# Patient Record
Sex: Female | Born: 1960
Health system: Southern US, Community
[De-identification: ages and names within clinical notes are randomized; demographics above are authoritative.]

## PROBLEM LIST (undated history)

## (undated) DIAGNOSIS — N289 Disorder of kidney and ureter, unspecified: Secondary | ICD-10-CM

## (undated) DIAGNOSIS — I10 Essential (primary) hypertension: Secondary | ICD-10-CM

---

## 2005-02-22 ENCOUNTER — Other Ambulatory Visit: Admission: RE | Admit: 2005-02-22 | Discharge: 2005-02-22 | Payer: Self-pay | Admitting: Family Medicine

## 2006-05-30 ENCOUNTER — Other Ambulatory Visit: Admission: RE | Admit: 2006-05-30 | Discharge: 2006-05-30 | Payer: Self-pay | Admitting: Family Medicine

## 2009-10-23 ENCOUNTER — Emergency Department (HOSPITAL_COMMUNITY): Admission: EM | Admit: 2009-10-23 | Discharge: 2009-10-23 | Payer: Self-pay | Admitting: Emergency Medicine

## 2009-10-25 ENCOUNTER — Emergency Department (HOSPITAL_COMMUNITY): Admission: EM | Admit: 2009-10-25 | Discharge: 2009-10-25 | Payer: Self-pay | Admitting: Emergency Medicine

## 2009-10-26 ENCOUNTER — Ambulatory Visit (HOSPITAL_COMMUNITY): Admission: RE | Admit: 2009-10-26 | Discharge: 2009-10-26 | Payer: Self-pay | Admitting: Emergency Medicine

## 2015-09-25 ENCOUNTER — Emergency Department (HOSPITAL_COMMUNITY): Payer: BLUE CROSS/BLUE SHIELD

## 2015-09-25 ENCOUNTER — Inpatient Hospital Stay (HOSPITAL_COMMUNITY)
Admission: EM | Admit: 2015-09-25 | Discharge: 2015-09-27 | DRG: 158 | Disposition: A | Discharge status: Home / Self Care | Payer: Self-pay | Attending: Internal Medicine | Admitting: Internal Medicine

## 2015-09-25 ENCOUNTER — Encounter (HOSPITAL_COMMUNITY): Payer: Self-pay

## 2015-09-25 DIAGNOSIS — K047 Periapical abscess without sinus: Principal | ICD-10-CM | POA: Diagnosis present

## 2015-09-25 DIAGNOSIS — Z6837 Body mass index (BMI) 37.0-37.9, adult: Secondary | ICD-10-CM

## 2015-09-25 DIAGNOSIS — N179 Acute kidney failure, unspecified: Secondary | ICD-10-CM | POA: Diagnosis present

## 2015-09-25 DIAGNOSIS — I10 Essential (primary) hypertension: Secondary | ICD-10-CM | POA: Diagnosis present

## 2015-09-25 DIAGNOSIS — N289 Disorder of kidney and ureter, unspecified: Secondary | ICD-10-CM | POA: Insufficient documentation

## 2015-09-25 DIAGNOSIS — I959 Hypotension, unspecified: Secondary | ICD-10-CM | POA: Diagnosis present

## 2015-09-25 DIAGNOSIS — E860 Dehydration: Secondary | ICD-10-CM | POA: Diagnosis present

## 2015-09-25 DIAGNOSIS — R55 Syncope and collapse: Secondary | ICD-10-CM | POA: Diagnosis present

## 2015-09-25 DIAGNOSIS — E876 Hypokalemia: Secondary | ICD-10-CM | POA: Diagnosis not present

## 2015-09-25 HISTORY — DX: Disorder of kidney and ureter, unspecified: N28.9

## 2015-09-25 HISTORY — DX: Essential (primary) hypertension: I10

## 2015-09-25 LAB — CBC WITH DIFFERENTIAL/PLATELET
Basophils Absolute: 0 10*3/uL (ref 0.0–0.1)
Basophils Relative: 0 %
EOS ABS: 0.1 10*3/uL (ref 0.0–0.7)
Eosinophils Relative: 1 %
HEMATOCRIT: 41.2 % (ref 36.0–46.0)
HEMOGLOBIN: 13.6 g/dL (ref 12.0–15.0)
LYMPHS ABS: 1.7 10*3/uL (ref 0.7–4.0)
LYMPHS PCT: 15 %
MCH: 27.1 pg (ref 26.0–34.0)
MCHC: 33 g/dL (ref 30.0–36.0)
MCV: 82.2 fL (ref 78.0–100.0)
Monocytes Absolute: 1.1 10*3/uL — ABNORMAL HIGH (ref 0.1–1.0)
Monocytes Relative: 9 %
NEUTROS ABS: 8.5 10*3/uL — AB (ref 1.7–7.7)
NEUTROS PCT: 75 %
Platelets: 345 10*3/uL (ref 150–400)
RBC: 5.01 MIL/uL (ref 3.87–5.11)
RDW: 13.4 % (ref 11.5–15.5)
WBC: 11.4 10*3/uL — AB (ref 4.0–10.5)

## 2015-09-25 LAB — URINALYSIS, ROUTINE W REFLEX MICROSCOPIC
Bilirubin Urine: NEGATIVE
Glucose, UA: NEGATIVE mg/dL
Ketones, ur: NEGATIVE mg/dL
NITRITE: NEGATIVE
PH: 5 (ref 5.0–8.0)
Protein, ur: NEGATIVE mg/dL
SPECIFIC GRAVITY, URINE: 1.012 (ref 1.005–1.030)

## 2015-09-25 LAB — COMPREHENSIVE METABOLIC PANEL
ALT: 17 U/L (ref 14–54)
AST: 16 U/L (ref 15–41)
Albumin: 3.3 g/dL — ABNORMAL LOW (ref 3.5–5.0)
Alkaline Phosphatase: 92 U/L (ref 38–126)
Anion gap: 13 (ref 5–15)
BILIRUBIN TOTAL: 0.4 mg/dL (ref 0.3–1.2)
BUN: 29 mg/dL — ABNORMAL HIGH (ref 6–20)
CALCIUM: 10 mg/dL (ref 8.9–10.3)
CHLORIDE: 100 mmol/L — AB (ref 101–111)
CO2: 23 mmol/L (ref 22–32)
Creatinine, Ser: 1.76 mg/dL — ABNORMAL HIGH (ref 0.44–1.00)
GFR, EST AFRICAN AMERICAN: 37 mL/min — AB (ref >60)
GFR, EST NON AFRICAN AMERICAN: 32 mL/min — AB (ref >60)
Glucose, Bld: 158 mg/dL — ABNORMAL HIGH (ref 65–99)
POTASSIUM: 3.7 mmol/L (ref 3.5–5.1)
Sodium: 136 mmol/L (ref 135–145)
Total Protein: 7.8 g/dL (ref 6.5–8.1)

## 2015-09-25 LAB — URINE MICROSCOPIC-ADD ON
BACTERIA UA: NONE SEEN
RBC / HPF: NONE SEEN RBC/hpf (ref 0–5)

## 2015-09-25 LAB — I-STAT CG4 LACTIC ACID, ED
LACTIC ACID, VENOUS: 1.56 mmol/L (ref 0.5–2.0)
Lactic Acid, Venous: 1.9 mmol/L (ref 0.5–2.0)

## 2015-09-25 MED ORDER — ACETAMINOPHEN 650 MG RE SUPP
650.0000 mg | Freq: Four times a day (QID) | RECTAL | Status: DC | PRN
Start: 2015-09-25 — End: 2015-09-27

## 2015-09-25 MED ORDER — IOHEXOL 300 MG/ML  SOLN
50.0000 mL | Freq: Once | INTRAMUSCULAR | Status: AC | PRN
  Administered 2015-09-25: 50 mL via INTRAVENOUS

## 2015-09-25 MED ORDER — MORPHINE SULFATE (PF) 4 MG/ML IV SOLN
4.0000 mg | Freq: Once | INTRAVENOUS | Status: AC
  Administered 2015-09-25: 4 mg via INTRAVENOUS
  Filled 2015-09-25: qty 1

## 2015-09-25 MED ORDER — ONDANSETRON HCL 4 MG PO TABS: 4.0000 mg | ORAL_TABLET | Freq: Four times a day (QID) | ORAL | Status: DC | PRN

## 2015-09-25 MED ORDER — ACETAMINOPHEN 325 MG PO TABS
650.0000 mg | ORAL_TABLET | Freq: Four times a day (QID) | ORAL | Status: DC | PRN
Start: 2015-09-25 — End: 2015-09-27
  Administered 2015-09-25 – 2015-09-27 (×6): 650 mg via ORAL
  Filled 2015-09-25 (×6): qty 2

## 2015-09-25 MED ORDER — CYCLOBENZAPRINE HCL 5 MG PO TABS
5.0000 mg | ORAL_TABLET | Freq: Three times a day (TID) | ORAL | Status: DC | PRN
  Administered 2015-09-25 – 2015-09-27 (×5): 5 mg via ORAL
  Filled 2015-09-25 (×5): qty 1

## 2015-09-25 MED ORDER — ONDANSETRON HCL 4 MG/2ML IJ SOLN
4.0000 mg | Freq: Four times a day (QID) | INTRAMUSCULAR | Status: DC | PRN
Start: 2015-09-25 — End: 2015-09-27

## 2015-09-25 MED ORDER — WHITE PETROLATUM GEL
Status: AC
  Administered 2015-09-25: 1
  Filled 2015-09-25: qty 1

## 2015-09-25 MED ORDER — SODIUM CHLORIDE 0.9 % IV BOLUS (SEPSIS)
1000.0000 mL | Freq: Once | INTRAVENOUS | Status: AC
  Administered 2015-09-25: 1000 mL via INTRAVENOUS

## 2015-09-25 MED ORDER — OXYCODONE HCL 5 MG PO TABS
10.0000 mg | ORAL_TABLET | ORAL | Status: DC | PRN
  Administered 2015-09-25 – 2015-09-27 (×10): 10 mg via ORAL
  Filled 2015-09-25 (×10): qty 2

## 2015-09-25 MED ORDER — CLINDAMYCIN PHOSPHATE 600 MG/50ML IV SOLN
600.0000 mg | Freq: Three times a day (TID) | INTRAVENOUS | Status: DC
Start: 2015-09-25 — End: 2015-09-27
  Administered 2015-09-25 – 2015-09-27 (×7): 600 mg via INTRAVENOUS
  Filled 2015-09-25 (×9): qty 50

## 2015-09-25 MED ORDER — SODIUM CHLORIDE 0.9 % IV SOLN
INTRAVENOUS | Status: AC
  Administered 2015-09-25: 19:00:00 via INTRAVENOUS

## 2015-09-25 MED ORDER — ONDANSETRON HCL 4 MG/2ML IJ SOLN
4.0000 mg | Freq: Once | INTRAMUSCULAR | Status: AC
  Administered 2015-09-25: 4 mg via INTRAVENOUS
  Filled 2015-09-25: qty 2

## 2015-09-25 MED ORDER — ALUM & MAG HYDROXIDE-SIMETH 200-200-20 MG/5ML PO SUSP: 30.0000 mL | Freq: Four times a day (QID) | ORAL | Status: DC | PRN

## 2015-09-25 NOTE — ED Provider Notes (Signed)
CSN: [id number]     Arrival date & time 09/25/15  0944 History   First MD Initiated Contact with Patient 09/25/15 1026     Chief Complaint  Patient presents with  . Dental Pain   HPI   [name] is an 55 y.o. female with h/o HTN who originally presented to the ED for dental pain, now being evaluated as possible sepsis patient. Pt was in triage having labs drawn. After labs were drawn she apparently lost consciousness, then vomited had urinary incontinence. She was immediately moved to trauma. In the room now pt is pale, cool, and clammy but alert and oriented. She does not remember what happened. She states she is here for a dental infection. She states she has never had a syncopal episode before. Pt reports she started having upper left dental pain and trismus on 12/25, [facility] and was prescribed PCN and Vicodin. She states neither of the meds have worked.  She states she has been unable to eat since Christmas. Feels very weak and tired. Denies other medical problems.   In the ED she has been hypotensive. She states she took her usual dose of lisinopril this morning but no other medications. She has not taken Vicodin today. She denies chest pain but is coughing and states she cannot breathe well. She does have marked trismus though no dysphagia. She is afebrile and not tachycardic.   History reviewed. No pertinent past medical history. History reviewed. No pertinent past surgical history. No family history on file. Social History  Substance Use Topics  . Smoking status: Never Smoker   . Smokeless tobacco: None  . Alcohol Use: No   OB History    No data available     Review of Systems  All other systems reviewed and are negative.     Allergies  Review of patient's allergies indicates no known allergies.  Home Medications   Prior to Admission medications   Not on File   BP 89/67 mmHg  Pulse 85  Temp(Src) 98.3 F (36.8 C) (Oral)  Resp 22  Ht 5' (1.524 m)  Wt  87.544 kg  BMI 37.69 kg/m2  SpO2 97%  LMP 09/25/2015 Physical Exam  Constitutional: She is oriented to person, place, and time. No distress.  HENT:  Right Ear: External ear normal.  Left Ear: External ear normal.  Nose: Nose normal.  Mouth/Throat: Mucous membranes are pale. There is trismus in the jaw.  Marked limitation to opening of jaw. Pt able to protrude tongue. No visible oral lesions or abscesses. Dentition with some gross caries and gingival inflammation/erythema, though no gross abscess noted. Cannot visualize posterior oropharynx due to trismus. No cervical LAD or masses.   Eyes: Conjunctivae and EOM are normal. Pupils are equal, round, and reactive to light.  Neck: Normal range of motion. Neck supple.  Cardiovascular: Normal rate, regular rhythm, normal heart sounds and intact distal pulses.   Pulmonary/Chest: Effort normal and breath sounds normal. No accessory muscle usage. No respiratory distress. She exhibits no tenderness.  Few bilateral expiratory wheezes that clear with coughing  Abdominal: Soft. Bowel sounds are normal. She exhibits no distension. There is no tenderness. There is no rebound and no guarding.  Musculoskeletal: She exhibits no edema.  Neurological: She is alert and oriented to person, place, and time. No cranial nerve deficit.  Skin: She is diaphoretic. There is pallor.  Psychiatric: She has a normal mood and affect.  Nursing note and vitals reviewed.  ED Course  Procedures (including critical care time) Labs Review Labs Reviewed  COMPREHENSIVE METABOLIC PANEL - Abnormal; Notable for the following:    Chloride 100 (*)    Glucose, Bld 158 (*)    BUN 29 (*)    Creatinine, Ser 1.76 (*)    Albumin 3.3 (*)    GFR calc non Af Amer 32 (*)    GFR calc Af Amer 37 (*)    All other components within normal limits  CBC WITH DIFFERENTIAL/PLATELET - Abnormal; Notable for the following:    WBC 11.4 (*)    Neutro Abs 8.5 (*)    Monocytes Absolute 1.1 (*)     All other components within normal limits  URINALYSIS, ROUTINE W REFLEX MICROSCOPIC (NOT AT [facility]) - Abnormal; Notable for the following:    APPearance CLOUDY (*)    Hgb urine dipstick SMALL (*)    Leukocytes, UA SMALL (*)    All other components within normal limits  URINE MICROSCOPIC-ADD ON - Abnormal; Notable for the following:    Squamous Epithelial / LPF 0-5 (*)    Casts WBC CAST (*)    All other components within normal limits  CULTURE, BLOOD (ROUTINE X 2)  CULTURE, BLOOD (ROUTINE X 2)  URINE CULTURE  I-STAT CG4 LACTIC ACID, ED  I-STAT CG4 LACTIC ACID, ED    Imaging Review Ct Maxillofacial W/cm  09/25/2015  CLINICAL DATA:  Hrs. EXAM: CT MAXILLOFACIAL WITH CONTRAST TECHNIQUE: Multidetector CT imaging of the maxillofacial structures was performed with intravenous contrast. Multiplanar CT image reconstructions were also generated. A small metallic BB was placed on the right temple in order to reliably differentiate right from left. CONTRAST:  50mL OMNIPAQUE IOHEXOL 300 MG/ML  SOLN COMPARISON:  None. FINDINGS: Bone windows show circumferential lucency around the roots of the left upper first molar, consistent with periapical abscess. There is some minimal edema/ inflammation in the soft tissues over the left base, but no underlying focal or drainable abscess. Temporomandibular joints are located. There is some mucosal thickening in the left maxillary sinus adjacent to the periapical abscess, but no air-fluid level. The remaining visualized paranasal sinuses are clear. IMPRESSION: Lucency around the roots of the upper left first molar consistent with periapical abscess. Electronically Signed   By: Kennith Center M.D.   On: 09/25/2015 13:33   Dg Chest Portable 1 View  09/25/2015  CLINICAL DATA:  Per patient she came to the ED because of a tooth infection, patients left jaw is swollen and patient started to get severely SOB today. EXAM: PORTABLE CHEST 1 VIEW COMPARISON:  None. FINDINGS: Numerous  leads and wires project over the chest. Mild right hemidiaphragm elevation. Midline trachea. Normal heart size. No pleural effusion or pneumothorax. Clear lungs. IMPRESSION: No acute cardiopulmonary disease. Electronically Signed   By: Jeronimo Greaves M.D.   On: 09/25/2015 11:39   I have personally reviewed and evaluated these images and lab results as part of my medical decision-making.   EKG Interpretation None      Filed Vitals:   09/25/15 1100 09/25/15 1107 09/25/15 1115 09/25/15 1338  BP: 102/78 106/72 115/78 127/88  Pulse: 73 63 73 73  Temp:      TempSrc:      Resp: 16 18 16 20   Height:      Weight:      SpO2: 94% 97% 96% 97%      MDM   Final diagnoses:  Syncope, unspecified syncope type  Renal insufficiency  Periapical abscess  BP improving with 2L of NS bolus. Slight white count of 11.4. Labs otherwise unrevealing. CXR negative.  Due to trismus and inability to perform good oral exam, CT maxillofacial was ordered to better evaluate extent of dental infection and other possible concomittant processes. CT showd periapical abscess and some mucosal thickening of left maxillary sinus but no underlying focal/drainable abscess above left first molar. Pt's vitals have been steadily improving. She has improvement in pain with pain meds. However, given syncopal episode with hypotension [facility] Syncope rule cannot exclude), failure of outpatient antibiotic therapy, inability new tolerate PO for adequate food intake, and new renal insufficiency, will call hospitalists for admission.   Spoke to Dr. Adrian Blackwater. Will admit to medicine and start on IV clindamycin in the ED.    Carlene Coria, PA-C 09/26/15 1938  Cathren Laine, MD 09/27/15 1010

## 2015-09-25 NOTE — ED Notes (Signed)
Pharmacy Tech at bedside  

## 2015-09-25 NOTE — H&P (Addendum)
History and Physical  Savannah Ray ZOX:[id number] DOB: February 25, 1961 DOA: 09/25/2015  Referring physician: Jolly MangoSerena Sam, PA-C, ED provider PCP: No primary care provider on file.   Chief Complaint: dental pain, syncope  HPI: Savannah Ray is a 55 y.o. female  With a history of hypertension who presents to the hospital for dental infection. The patient was placed on penicillin 5 days ago for a dental abscess that started a week ago. She is out of difficult time opening her mouth has not been eating foods comfortably because of the pain. Vicodin does not help her pain. Chewing worsens her pain. In the triage area, the patient felt lightheaded, dizzy, felt hot and flushed with nausea and had a syncopal episode. She was noted be quite hypotensive. She was fluid resuscitated in the emergency department. CT of her head shows an apical dental abscess in the left maxillary area.   Review of Systems:   Pt complains of Night chills.  Pt denies any fevers, nausea, vomiting, diarrhea, constipation, abdominal pain, shortness of breath, dyspnea on exertion, orthopnea, cough, wheezing, palpitations, headache, vision changes, lightheadedness, dizziness, diarrhea, constipation, melena, rectal bleeding.  Review of systems are otherwise negative  Past Medical History  Diagnosis Date  . Hypertension    History reviewed. No pertinent past surgical history. Social History:  reports that she has never smoked. She does not have any smokeless tobacco history on file. She reports that she does not drink alcohol or use illicit drugs. Patient lives at home & is able to participate in activities of daily living  No Known Allergies  Patient unaware of family history  Prior to Admission medications   Medication Sig Start Date End Date Taking? Authorizing Provider  Biotin w/ Vitamins C & E (HAIR SKIN & NAILS GUMMIES) 1250-7.5-7.5 MCG-MG-UNT CHEW Chew 1 tablet by mouth daily.   Yes Historical Provider, MD    ibuprofen (ADVIL,MOTRIN) 200 MG tablet Take 200 mg by mouth every 6 (six) hours as needed for moderate pain.   Yes Historical Provider, MD  lisinopril-hydrochlorothiazide (PRINZIDE,ZESTORETIC) 20-12.5 MG tablet Take 1 tablet by mouth daily.   Yes Historical Provider, MD  vitamin B-12 (CYANOCOBALAMIN) 100 MCG tablet Take 100 mcg by mouth daily.   Yes Historical Provider, MD    Physical Exam: BP 128/80 mmHg  Pulse 82  Temp(Src) 98.3 F (36.8 C) (Oral)  Resp 21  Ht 5' (1.524 m)  Wt 87.544 kg (193 lb)  BMI 37.69 kg/m2  SpO2 98%  LMP 09/25/2015  General: Middle-aged black female. Awake and alert and oriented x3. No acute cardiopulmonary distress.  Eyes: Pupils equal, round, reactive to light. Extraocular muscles are intact. Sclerae anicteric and noninjected.  ENT:  Dry mucosal membranes. No mucosal lesions. Significant trismus to approximately 3 cm Neck: Neck supple without lymphadenopathy. No carotid bruits. No masses palpated.  Cardiovascular: Regular rate with normal S1-S2 sounds. No murmurs, rubs, gallops auscultated. No JVD.  Respiratory: Good respiratory effort with no wheezes, rales, rhonchi. Lungs clear to auscultation bilaterally.  Abdomen: Soft, nontender, nondistended. Active bowel sounds. No masses or hepatosplenomegaly  Skin: Dry, warm to touch. 2+ dorsalis pedis and radial pulses. Musculoskeletal: No calf or leg pain. All major joints not erythematous nontender.  Psychiatric: Intact judgment and insight.  Neurologic: No focal neurological deficits. Cranial nerves II through XII are grossly intact.           Labs on Admission:  Basic Metabolic Panel:  Recent Labs Lab 09/25/15 1037  NA 136  K 3.7  CL 100*  CO2 23  GLUCOSE 158*  BUN 29*  CREATININE 1.76*  CALCIUM 10.0   Liver Function Tests:  Recent Labs Lab 09/25/15 1037  AST 16  ALT 17  ALKPHOS 92  BILITOT 0.4  PROT 7.8  ALBUMIN 3.3*   No results for input(s): LIPASE, AMYLASE in the last 168  hours. No results for input(s): AMMONIA in the last 168 hours. CBC:  Recent Labs Lab 09/25/15 1037  WBC 11.4*  NEUTROABS 8.5*  HGB 13.6  HCT 41.2  MCV 82.2  PLT 345   Cardiac Enzymes: No results for input(s): CKTOTAL, CKMB, CKMBINDEX, TROPONINI in the last 168 hours.  BNP (last 3 results) No results for input(s): BNP in the last 8760 hours.  ProBNP (last 3 results) No results for input(s): PROBNP in the last 8760 hours.  CBG: No results for input(s): GLUCAP in the last 168 hours.  Radiological Exams on Admission: Ct Maxillofacial W/cm  09/25/2015  CLINICAL DATA:  Hrs. EXAM: CT MAXILLOFACIAL WITH CONTRAST TECHNIQUE: Multidetector CT imaging of the maxillofacial structures was performed with intravenous contrast. Multiplanar CT image reconstructions were also generated. A small metallic BB was placed on the right temple in order to reliably differentiate right from left. CONTRAST:  50mL OMNIPAQUE IOHEXOL 300 MG/ML  SOLN COMPARISON:  None. FINDINGS: Bone windows show circumferential lucency around the roots of the left upper first molar, consistent with periapical abscess. There is some minimal edema/ inflammation in the soft tissues over the left base, but no underlying focal or drainable abscess. Temporomandibular joints are located. There is some mucosal thickening in the left maxillary sinus adjacent to the periapical abscess, but no air-fluid level. The remaining visualized paranasal sinuses are clear. IMPRESSION: Lucency around the roots of the upper left first molar consistent with periapical abscess. Electronically Signed   By: Kennith Center M.D.   On: 09/25/2015 13:33   Dg Chest Portable 1 View  09/25/2015  CLINICAL DATA:  Per patient she came to the ED because of a tooth infection, patients left jaw is swollen and patient started to get severely SOB today. EXAM: PORTABLE CHEST 1 VIEW COMPARISON:  None. FINDINGS: Numerous leads and wires project over the chest. Mild right  hemidiaphragm elevation. Midline trachea. Normal heart size. No pleural effusion or pneumothorax. Clear lungs. IMPRESSION: No acute cardiopulmonary disease. Electronically Signed   By: Jeronimo Greaves M.D.   On: 09/25/2015 11:39    Assessment/Plan Present on Admission:  . Periapical abscess . Vasovagal syncope . Acute renal injury (HCC) . Dehydration  This patient was discussed with the ED physician, including pertinent vitals, physical exam findings, labs, and imaging.  We also discussed care given by the ED provider.  #1 vasovagal syncope  Observation on telemetry  IV fluids overnight #2 periapical abscess - failed outpatient  Clindamycin 600 mg every 8 hours  Recheck CBC in the morning  Flexeril for trismus  Oxycodone for pain #3 acute renal injury  Will fluid resuscitate  Recheck creatinine in the morning #4 dehydration  DVT prophylaxis: SCDs  Consultants: None  Code Status: Full code  Family Communication: Husband in the room   Disposition Plan: Observation with likely discharge tomorrow   Levie Heritage, DO [facility] Pager [phone]

## 2015-09-25 NOTE — ED Notes (Signed)
Pt started feeling

## 2015-09-25 NOTE — ED Notes (Signed)
Pot returns from radiology

## 2015-09-25 NOTE — ED Notes (Signed)
After blood drawn in triage, pt had syncopal episode with vomiting and urinary incontinence. Moved immediately to Trauma A. Care assumed by Trauma staff.

## 2015-09-25 NOTE — ED Notes (Addendum)
Pt. Having lt. Upper tooth pain was seen by a dentist and placed on antibiotics and Vicodin, one week ago but has not had any relief.  Pt. Denies any an/v/d  . Pt. Is very weak and has not ate any foods since Christmas. She also stopped taking her medication due to no relief. Pt. Is very lethargic in triage , she will not stay in the seat,.  She wants to lay on the floor.  Assistance needed to help her stand and move.  No swelling noted to her face

## 2015-09-25 NOTE — ED Notes (Signed)
Pt assisted to BR via w/c without any dizziness/weakness. Report given to [facility], Charity fundraiserN.

## 2015-09-26 DIAGNOSIS — E860 Dehydration: Secondary | ICD-10-CM

## 2015-09-26 DIAGNOSIS — K047 Periapical abscess without sinus: Principal | ICD-10-CM

## 2015-09-26 DIAGNOSIS — R55 Syncope and collapse: Secondary | ICD-10-CM

## 2015-09-26 DIAGNOSIS — N179 Acute kidney failure, unspecified: Secondary | ICD-10-CM

## 2015-09-26 LAB — CBC
HEMATOCRIT: 34.2 % — AB (ref 36.0–46.0)
Hemoglobin: 11.2 g/dL — ABNORMAL LOW (ref 12.0–15.0)
MCH: 26.8 pg (ref 26.0–34.0)
MCHC: 32.7 g/dL (ref 30.0–36.0)
MCV: 81.8 fL (ref 78.0–100.0)
Platelets: 316 10*3/uL (ref 150–400)
RBC: 4.18 MIL/uL (ref 3.87–5.11)
RDW: 13.5 % (ref 11.5–15.5)
WBC: 12.4 10*3/uL — AB (ref 4.0–10.5)

## 2015-09-26 LAB — URINE CULTURE

## 2015-09-26 LAB — BASIC METABOLIC PANEL
ANION GAP: 9 (ref 5–15)
BUN: 21 mg/dL — AB (ref 6–20)
CALCIUM: 8.8 mg/dL — AB (ref 8.9–10.3)
CO2: 24 mmol/L (ref 22–32)
Chloride: 105 mmol/L (ref 101–111)
Creatinine, Ser: 1.27 mg/dL — ABNORMAL HIGH (ref 0.44–1.00)
GFR calc Af Amer: 55 mL/min — ABNORMAL LOW (ref >60)
GFR, EST NON AFRICAN AMERICAN: 48 mL/min — AB (ref >60)
GLUCOSE: 108 mg/dL — AB (ref 65–99)
POTASSIUM: 3.7 mmol/L (ref 3.5–5.1)
SODIUM: 138 mmol/L (ref 135–145)

## 2015-09-26 MED ORDER — SODIUM CHLORIDE 0.9 % IV SOLN
INTRAVENOUS | Status: DC
  Administered 2015-09-26: 17:00:00 via INTRAVENOUS

## 2015-09-26 NOTE — Progress Notes (Signed)
[facility] PROGRESS NOTE  Marlin CanaryRosemary Matthews AVW:[id number] DOB: 1961-06-22 DOA: 09/25/2015 PCP: No primary care provider on file.  Assessment/Plan: 1-Left periapical abscess: due to see a dentist in Jan 23th -will continue IV antibiotics -PRN analgesics -follow response -will try to arrange outpatient dental visit sooner -no fever  2-AKI: -pre-renal azotemia, due to dehydration and continue use of nephrotoxic agents -will continue IVF's -Cr trending down -continue holding lisinopril and HCTZ  3-HTN: stable -will monitor off medications  4-syncope: due to orthostasis Vs vasovagal  -denies lightheadedness -will check orthostatic VS in am -continue IVF's    Code Status: Full Family Communication: no family at bedside  Disposition Plan: remains in the hospital; continue PRN analgesics, IVF's and antibiotics. Hopefully home in the next 24-48 hours.   Consultants:  None   Procedures:  See below for x-ray reports   Antibiotics:  Cleocin 09/25/15  HPI/Subjective: Afebrile, no CP, no SOB. Still with pain in her mouth and swelling in her jaw  Objective: Filed Vitals:   09/26/15 0637 09/26/15 1300  BP: 122/76 121/78  Pulse: 65 62  Temp: 98.1 F (36.7 C) 98.7 F (37.1 C)  Resp: 18 16    Intake/Output Summary (Last 24 hours) at 09/26/15 1626 Last data filed at 09/26/15 1308  Gross per 24 hour  Intake   2431 ml  Output   1000 ml  Net   1431 ml   Filed Weights   09/25/15 1015  Weight: 87.544 kg (193 lb)    Exam:   General:  Afebrile, feeling better and tolerating CLD; patient still with a lot of pain on her mouth. Left side swelling appreciated  Cardiovascular: S1 and S2, no rubs or gallops  Respiratory: CTA bilaterally   Abdomen: soft, NT, ND, positive BS  Musculoskeletal: no edema, no cyanosis, no clubbing   Data Reviewed: Basic Metabolic Panel:  Recent Labs Lab 09/25/15 1037 09/26/15 0431  NA 136 138  K 3.7 3.7  CL 100* 105  CO2 23  24  GLUCOSE 158* 108*  BUN 29* 21*  CREATININE 1.76* 1.27*  CALCIUM 10.0 8.8*   Liver Function Tests:  Recent Labs Lab 09/25/15 1037  AST 16  ALT 17  ALKPHOS 92  BILITOT 0.4  PROT 7.8  ALBUMIN 3.3*   CBC:  Recent Labs Lab 09/25/15 1037 09/26/15 0431  WBC 11.4* 12.4*  NEUTROABS 8.5*  --   HGB 13.6 11.2*  HCT 41.2 34.2*  MCV 82.2 81.8  PLT 345 316   CBG: No results for input(s): GLUCAP in the last 168 hours.  Recent Results (from the past 240 hour(s))  Culture, blood (routine x 2)     Status: None (Preliminary result)   Collection Time: 09/25/15 11:00 AM  Result Value Ref Range Status   Specimen Description BLOOD RIGHT ANTECUBITAL  Final   Special Requests BOTTLES DRAWN AEROBIC AND ANAEROBIC 5CC  Final   Culture NO GROWTH < 24 HOURS  Final   Report Status PENDING  Incomplete  Culture, blood (routine x 2)     Status: None (Preliminary result)   Collection Time: 09/25/15 11:20 AM  Result Value Ref Range Status   Specimen Description BLOOD RIGHT ANTECUBITAL  Final   Special Requests BOTTLES DRAWN AEROBIC AND ANAEROBIC 5CC  Final   Culture NO GROWTH < 24 HOURS  Final   Report Status PENDING  Incomplete  Urine culture     Status: None   Collection Time: 09/25/15  1:00 PM  Result Value Ref Range Status  Specimen Description URINE, CLEAN CATCH  Final   Special Requests NONE  Final   Culture MULTIPLE SPECIES PRESENT, SUGGEST RECOLLECTION  Final   Report Status 09/26/2015 FINAL  Final     Studies: Ct Maxillofacial W/cm  09/25/2015  CLINICAL DATA:  Hrs. EXAM: CT MAXILLOFACIAL WITH CONTRAST TECHNIQUE: Multidetector CT imaging of the maxillofacial structures was performed with intravenous contrast. Multiplanar CT image reconstructions were also generated. A small metallic BB was placed on the right temple in order to reliably differentiate right from left. CONTRAST:  50mL OMNIPAQUE IOHEXOL 300 MG/ML  SOLN COMPARISON:  None. FINDINGS: Bone windows show circumferential  lucency around the roots of the left upper first molar, consistent with periapical abscess. There is some minimal edema/ inflammation in the soft tissues over the left base, but no underlying focal or drainable abscess. Temporomandibular joints are located. There is some mucosal thickening in the left maxillary sinus adjacent to the periapical abscess, but no air-fluid level. The remaining visualized paranasal sinuses are clear. IMPRESSION: Lucency around the roots of the upper left first molar consistent with periapical abscess. Electronically Signed   By: Kennith Center M.D.   On: 09/25/2015 13:33   Dg Chest Portable 1 View  09/25/2015  CLINICAL DATA:  Per patient she came to the ED because of a tooth infection, patients left jaw is swollen and patient started to get severely SOB today. EXAM: PORTABLE CHEST 1 VIEW COMPARISON:  None. FINDINGS: Numerous leads and wires project over the chest. Mild right hemidiaphragm elevation. Midline trachea. Normal heart size. No pleural effusion or pneumothorax. Clear lungs. IMPRESSION: No acute cardiopulmonary disease. Electronically Signed   By: Jeronimo Greaves M.D.   On: 09/25/2015 11:39    Scheduled Meds: . clindamycin (CLEOCIN) IV  600 mg Intravenous 3 times per day   Continuous Infusions:   Active Problems:   Periapical abscess   Vasovagal syncope   Acute renal injury (HCC)   Dehydration    Time spent: 30 minutes    Vassie Loll  [facility] Pager [phone]. If 7PM-7AM, please contact night-coverage at [email or web address], password [facility] 09/26/2015, 4:26 PM

## 2015-09-27 ENCOUNTER — Encounter (HOSPITAL_COMMUNITY): Payer: Self-pay | Admitting: Internal Medicine

## 2015-09-27 DIAGNOSIS — N289 Disorder of kidney and ureter, unspecified: Secondary | ICD-10-CM

## 2015-09-27 LAB — CBC
HEMATOCRIT: 34.3 % — AB (ref 36.0–46.0)
HEMOGLOBIN: 11 g/dL — AB (ref 12.0–15.0)
MCH: 26.3 pg (ref 26.0–34.0)
MCHC: 32.1 g/dL (ref 30.0–36.0)
MCV: 82.1 fL (ref 78.0–100.0)
Platelets: 334 10*3/uL (ref 150–400)
RBC: 4.18 MIL/uL (ref 3.87–5.11)
RDW: 13.7 % (ref 11.5–15.5)
WBC: 11.3 10*3/uL — AB (ref 4.0–10.5)

## 2015-09-27 LAB — BASIC METABOLIC PANEL
ANION GAP: 9 (ref 5–15)
BUN: 14 mg/dL (ref 6–20)
CO2: 26 mmol/L (ref 22–32)
Calcium: 8.5 mg/dL — ABNORMAL LOW (ref 8.9–10.3)
Chloride: 102 mmol/L (ref 101–111)
Creatinine, Ser: 0.97 mg/dL (ref 0.44–1.00)
GFR calc Af Amer: >60 mL/min (ref >60)
GLUCOSE: 106 mg/dL — AB (ref 65–99)
POTASSIUM: 3.2 mmol/L — AB (ref 3.5–5.1)
Sodium: 137 mmol/L (ref 135–145)

## 2015-09-27 MED ORDER — OXYCODONE HCL 10 MG PO TABS: 10.0000 mg | ORAL_TABLET | ORAL | Status: DC | PRN

## 2015-09-27 MED ORDER — CLINDAMYCIN HCL 300 MG PO CAPS: 300.0000 mg | ORAL_CAPSULE | Freq: Three times a day (TID) | ORAL | Status: DC

## 2015-09-27 MED ORDER — ACETAMINOPHEN 500 MG PO TABS: 1000.0000 mg | ORAL_TABLET | Freq: Three times a day (TID) | ORAL | Status: AC | PRN

## 2015-09-27 MED ORDER — LISINOPRIL-HYDROCHLOROTHIAZIDE 20-12.5 MG PO TABS: 1.0000 | ORAL_TABLET | Freq: Every day | ORAL | Status: AC

## 2015-09-27 MED ORDER — POTASSIUM CHLORIDE CRYS ER 20 MEQ PO TBCR
40.0000 meq | EXTENDED_RELEASE_TABLET | ORAL | Status: AC
  Administered 2015-09-27 (×2): 40 meq via ORAL
  Filled 2015-09-27 (×2): qty 2

## 2015-09-27 NOTE — Progress Notes (Signed)
Discharge home. Home discharge instruction,no question verbalized.

## 2015-09-27 NOTE — Discharge Summary (Signed)
Physician Discharge Summary  Tierria Watson ZOX:[id number] DOB: 1961-02-12 DOA: 09/25/2015  PCP: No primary care provider on file.  Admit date: 09/25/2015 Discharge date: 09/27/2015  Time spent: 35 minutes  Recommendations for Outpatient Follow-up:  1- repeat BMET to follow electrolytes and renal function 2-reassess BP and adjust medications as needed    Discharge Diagnoses:  Left Periapical abscess Vasovagal syncope Acute renal injury (HCC) Dehydration Essential HTN  Discharge Condition: stable and improved. Arrangements has been done for patient to follow with oral surgery on 09/28/15. No nausea, no vomiting and complete resolution of AKI.  Diet recommendation: full liquid diet   Filed Weights   09/25/15 1015  Weight: 87.544 kg (193 lb)    History of present illness:  55 y.o. female With a past medical history of hypertension; who presented to the hospital for dental infection. The patient was placed on penicillin 5 days ago for a dental abscess that started a week prior to admission. She is having a lot of difficult opening her mouth and has not been able to drink or eat properly. Vicodin does not help her pain. Chewing worsens her pain. In the triage area, the patient felt lightheaded, dizzy, felt hot and flushed; she also experienced associated nausea and felt as she will pass out. She was noted be quite hypotensive on presentation and was found to have AKI. She was fluid resuscitated in the emergency department. CT of her head shows an apical dental abscess in the left maxillary area.  Hospital Course:  1-Left periapical abscess: due to see a dentist in Jan 23th -will discharge on clindamycin for 10 days -continue PRN analgesics -low grade temp overnight, but non-toxic or with signs of systemic infections -will follow up with Dr. Barbette Merino (Oral surgeon) on 09/28/15 at 7:30 am to take care of abscess and any needed extractions   2-AKI: -pre-renal azotemia, due to dehydration and  continue use of nephrotoxic agents -advise to keep herself well hydrated  -Cr back to normal now -continue holding NSAIDs, lisinopril and HCTZ  3-HTN: stable -will monitor off medications until able to tolerate good hydration and PO intake  4-syncope: due to orthostasis Vs vasovagal  -denies lightheadedness and neg orthostatic VS -symptoms resolved with IVF's  5-hypokalemia: -repleted    Procedures:  See below for x-ray reports   Consultations:  None   Discharge Exam: Filed Vitals:   09/27/15 0551 09/27/15 1428  BP: 125/77 141/89  Pulse: 77 74  Temp: 99.2 F (37.3 C) 100.9 F (38.3 C)  Resp: 16 18    General: low grade temp overnight; feeling much better overall and tolerating full liquid diet; patient still with a lot of pain in her mouth. Left side swelling appreciated (with some improvement)  Cardiovascular: S1 and S2, no rubs or gallops  Respiratory: CTA bilaterally   Abdomen: soft, NT, ND, positive BS  Musculoskeletal: no edema, no cyanosis, no clubbing   Discharge Instructions   Discharge Instructions    Discharge instructions    Complete by:  As directed   Follow with oral surgeon as instructed Take medications as prescribed Follow full liquid diet Maintain adequate hydration Keep yourself well hydrated          Current Discharge Medication List    START taking these medications   Details  acetaminophen (TYLENOL) 500 MG tablet Take 2 tablets (1,000 mg total) by mouth every 8 (eight) hours as needed for mild pain, fever or headache (or Fever >/= 101).    clindamycin (CLEOCIN) 300  MG capsule Take 1 capsule (300 mg total) by mouth 3 (three) times daily. Qty: 30 capsule, Refills: 0    oxyCODONE 10 MG TABS Take 1 tablet (10 mg total) by mouth every 4 (four) hours as needed for severe pain. Qty: 30 tablet, Refills: 0      CONTINUE these medications which have CHANGED   Details  lisinopril-hydrochlorothiazide (PRINZIDE,ZESTORETIC) 20-12.5  MG tablet Take 1 tablet by mouth daily. Hold this medication until able to drink properly again      CONTINUE these medications which have NOT CHANGED   Details  Biotin w/ Vitamins C & E (HAIR SKIN & NAILS GUMMIES) 1250-7.5-7.5 MCG-MG-UNT CHEW Chew 1 tablet by mouth daily.    vitamin B-12 (CYANOCOBALAMIN) 100 MCG tablet Take 100 mcg by mouth daily.      STOP taking these medications     ibuprofen (ADVIL,MOTRIN) 200 MG tablet        No Known Allergies Follow-up Information    Follow up with Georgia LopesJENSEN,SCOTT M, DDS On 09/28/2015.   Specialty:  Oral Surgery   Why:  at 7:30   Contact information:   [address] [place] [address] [phone]       The results of significant diagnostics from this hospitalization (including imaging, microbiology, ancillary and laboratory) are listed below for reference.    Significant Diagnostic Studies: Ct Maxillofacial W/cm  09/25/2015  CLINICAL DATA:  Hrs. EXAM: CT MAXILLOFACIAL WITH CONTRAST TECHNIQUE: Multidetector CT imaging of the maxillofacial structures was performed with intravenous contrast. Multiplanar CT image reconstructions were also generated. A small metallic BB was placed on the right temple in order to reliably differentiate right from left. CONTRAST:  50mL OMNIPAQUE IOHEXOL 300 MG/ML  SOLN COMPARISON:  None. FINDINGS: Bone windows show circumferential lucency around the roots of the left upper first molar, consistent with periapical abscess. There is some minimal edema/ inflammation in the soft tissues over the left base, but no underlying focal or drainable abscess. Temporomandibular joints are located. There is some mucosal thickening in the left maxillary sinus adjacent to the periapical abscess, but no air-fluid level. The remaining visualized paranasal sinuses are clear. IMPRESSION: Lucency around the roots of the upper left first molar consistent with periapical abscess. Electronically Signed   By: Kennith CenterEric  Mansell M.D.   On:  09/25/2015 13:33   Dg Chest Portable 1 View  09/25/2015  CLINICAL DATA:  Per patient she came to the ED because of a tooth infection, patients left jaw is swollen and patient started to get severely SOB today. EXAM: PORTABLE CHEST 1 VIEW COMPARISON:  None. FINDINGS: Numerous leads and wires project over the chest. Mild right hemidiaphragm elevation. Midline trachea. Normal heart size. No pleural effusion or pneumothorax. Clear lungs. IMPRESSION: No acute cardiopulmonary disease. Electronically Signed   By: Jeronimo GreavesKyle  Talbot M.D.   On: 09/25/2015 11:39    Microbiology: Recent Results (from the past 240 hour(s))  Culture, blood (routine x 2)     Status: None (Preliminary result)   Collection Time: 09/25/15 11:00 AM  Result Value Ref Range Status   Specimen Description BLOOD RIGHT ANTECUBITAL  Final   Special Requests BOTTLES DRAWN AEROBIC AND ANAEROBIC 5CC  Final   Culture NO GROWTH 2 DAYS  Final   Report Status PENDING  Incomplete  Culture, blood (routine x 2)     Status: None (Preliminary result)   Collection Time: 09/25/15 11:20 AM  Result Value Ref Range Status   Specimen Description BLOOD RIGHT ANTECUBITAL  Final  Special Requests BOTTLES DRAWN AEROBIC AND ANAEROBIC 5CC  Final   Culture NO GROWTH 2 DAYS  Final   Report Status PENDING  Incomplete  Urine culture     Status: None   Collection Time: 09/25/15  1:00 PM  Result Value Ref Range Status   Specimen Description URINE, CLEAN CATCH  Final   Special Requests NONE  Final   Culture MULTIPLE SPECIES PRESENT, SUGGEST RECOLLECTION  Final   Report Status 09/26/2015 FINAL  Final     Labs: Basic Metabolic Panel:  Recent Labs Lab 09/25/15 1037 09/26/15 0431 09/27/15 0532  NA 136 138 137  K 3.7 3.7 3.2*  CL 100* 105 102  CO2 23 24 26   GLUCOSE 158* 108* 106*  BUN 29* 21* 14  CREATININE 1.76* 1.27* 0.97  CALCIUM 10.0 8.8* 8.5*   Liver Function Tests:  Recent Labs Lab 09/25/15 1037  AST 16  ALT 17  ALKPHOS 92  BILITOT 0.4   PROT 7.8  ALBUMIN 3.3*   CBC:  Recent Labs Lab 09/25/15 1037 09/26/15 0431 09/27/15 0532  WBC 11.4* 12.4* 11.3*  NEUTROABS 8.5*  --   --   HGB 13.6 11.2* 11.0*  HCT 41.2 34.2* 34.3*  MCV 82.2 81.8 82.1  PLT 345 316 334    Signed:  Vassie Loll MD   [facility] 09/27/2015, 3:53 PM

## 2015-09-30 LAB — CULTURE, BLOOD (ROUTINE X 2)
CULTURE: NO GROWTH
Culture: NO GROWTH

## 2016-04-27 DIAGNOSIS — I10 Essential (primary) hypertension: Secondary | ICD-10-CM | POA: Diagnosis not present

## 2016-04-27 DIAGNOSIS — R5383 Other fatigue: Secondary | ICD-10-CM | POA: Diagnosis not present

## 2016-04-27 DIAGNOSIS — M79671 Pain in right foot: Secondary | ICD-10-CM | POA: Diagnosis not present

## 2016-04-27 DIAGNOSIS — M7731 Calcaneal spur, right foot: Secondary | ICD-10-CM | POA: Diagnosis not present

## 2016-05-11 DIAGNOSIS — M7731 Calcaneal spur, right foot: Secondary | ICD-10-CM | POA: Diagnosis not present

## 2016-05-11 DIAGNOSIS — M79671 Pain in right foot: Secondary | ICD-10-CM | POA: Diagnosis not present

## 2016-05-15 DIAGNOSIS — M2141 Flat foot [pes planus] (acquired), right foot: Secondary | ICD-10-CM | POA: Diagnosis not present

## 2016-05-15 DIAGNOSIS — M722 Plantar fascial fibromatosis: Secondary | ICD-10-CM | POA: Diagnosis not present

## 2016-07-09 DIAGNOSIS — H524 Presbyopia: Secondary | ICD-10-CM | POA: Diagnosis not present

## 2016-07-30 DIAGNOSIS — E7800 Pure hypercholesterolemia, unspecified: Secondary | ICD-10-CM | POA: Diagnosis not present

## 2016-07-30 DIAGNOSIS — M1990 Unspecified osteoarthritis, unspecified site: Secondary | ICD-10-CM | POA: Diagnosis not present

## 2016-07-30 DIAGNOSIS — I10 Essential (primary) hypertension: Secondary | ICD-10-CM | POA: Diagnosis not present

## 2016-11-19 DIAGNOSIS — I10 Essential (primary) hypertension: Secondary | ICD-10-CM | POA: Diagnosis not present

## 2016-11-19 DIAGNOSIS — M1990 Unspecified osteoarthritis, unspecified site: Secondary | ICD-10-CM | POA: Diagnosis not present

## 2017-01-28 DIAGNOSIS — Z6836 Body mass index (BMI) 36.0-36.9, adult: Secondary | ICD-10-CM | POA: Diagnosis not present

## 2017-01-28 DIAGNOSIS — Z1231 Encounter for screening mammogram for malignant neoplasm of breast: Secondary | ICD-10-CM | POA: Diagnosis not present

## 2017-01-28 DIAGNOSIS — Z01419 Encounter for gynecological examination (general) (routine) without abnormal findings: Secondary | ICD-10-CM | POA: Diagnosis not present

## 2017-02-01 ENCOUNTER — Other Ambulatory Visit: Payer: Self-pay | Admitting: Obstetrics & Gynecology

## 2017-02-01 DIAGNOSIS — R928 Other abnormal and inconclusive findings on diagnostic imaging of breast: Secondary | ICD-10-CM

## 2017-02-06 ENCOUNTER — Ambulatory Visit
Admission: RE | Admit: 2017-02-06 | Discharge: 2017-02-06 | Disposition: A | Discharge status: Home / Self Care | Payer: BLUE CROSS/BLUE SHIELD | Source: Ambulatory Visit | Attending: Obstetrics & Gynecology | Admitting: Obstetrics & Gynecology

## 2017-02-06 DIAGNOSIS — R928 Other abnormal and inconclusive findings on diagnostic imaging of breast: Secondary | ICD-10-CM

## 2017-02-26 ENCOUNTER — Encounter: Payer: Self-pay | Admitting: Obstetrics & Gynecology

## 2017-03-01 DIAGNOSIS — E7800 Pure hypercholesterolemia, unspecified: Secondary | ICD-10-CM | POA: Diagnosis not present

## 2017-03-01 DIAGNOSIS — I10 Essential (primary) hypertension: Secondary | ICD-10-CM | POA: Diagnosis not present

## 2017-03-01 DIAGNOSIS — M1990 Unspecified osteoarthritis, unspecified site: Secondary | ICD-10-CM | POA: Diagnosis not present

## 2017-04-10 DIAGNOSIS — N760 Acute vaginitis: Secondary | ICD-10-CM | POA: Diagnosis not present

## 2017-04-10 DIAGNOSIS — N390 Urinary tract infection, site not specified: Secondary | ICD-10-CM | POA: Diagnosis not present

## 2017-07-05 DIAGNOSIS — R5383 Other fatigue: Secondary | ICD-10-CM | POA: Diagnosis not present

## 2017-07-05 DIAGNOSIS — M1990 Unspecified osteoarthritis, unspecified site: Secondary | ICD-10-CM | POA: Diagnosis not present

## 2017-07-05 DIAGNOSIS — I10 Essential (primary) hypertension: Secondary | ICD-10-CM | POA: Diagnosis not present

## 2017-07-05 DIAGNOSIS — E039 Hypothyroidism, unspecified: Secondary | ICD-10-CM | POA: Diagnosis not present

## 2017-07-05 DIAGNOSIS — E7800 Pure hypercholesterolemia, unspecified: Secondary | ICD-10-CM | POA: Diagnosis not present

## 2017-07-05 DIAGNOSIS — Z23 Encounter for immunization: Secondary | ICD-10-CM | POA: Diagnosis not present

## 2017-07-19 DIAGNOSIS — R799 Abnormal finding of blood chemistry, unspecified: Secondary | ICD-10-CM | POA: Diagnosis not present

## 2017-07-19 DIAGNOSIS — I701 Atherosclerosis of renal artery: Secondary | ICD-10-CM | POA: Diagnosis not present

## 2017-07-19 DIAGNOSIS — R944 Abnormal results of kidney function studies: Secondary | ICD-10-CM | POA: Diagnosis not present

## 2017-07-19 DIAGNOSIS — I10 Essential (primary) hypertension: Secondary | ICD-10-CM | POA: Diagnosis not present

## 2017-07-25 DIAGNOSIS — R93421 Abnormal radiologic findings on diagnostic imaging of right kidney: Secondary | ICD-10-CM | POA: Diagnosis not present

## 2017-07-25 DIAGNOSIS — R944 Abnormal results of kidney function studies: Secondary | ICD-10-CM | POA: Diagnosis not present

## 2017-07-25 DIAGNOSIS — R799 Abnormal finding of blood chemistry, unspecified: Secondary | ICD-10-CM | POA: Diagnosis not present

## 2017-07-25 DIAGNOSIS — I701 Atherosclerosis of renal artery: Secondary | ICD-10-CM | POA: Diagnosis not present

## 2017-08-02 DIAGNOSIS — Q271 Congenital renal artery stenosis: Secondary | ICD-10-CM | POA: Diagnosis not present

## 2017-08-02 DIAGNOSIS — N3000 Acute cystitis without hematuria: Secondary | ICD-10-CM | POA: Diagnosis not present

## 2017-09-13 ENCOUNTER — Encounter: Payer: Self-pay | Admitting: Vascular Surgery

## 2017-09-13 ENCOUNTER — Other Ambulatory Visit: Payer: Self-pay

## 2017-09-13 ENCOUNTER — Ambulatory Visit: Payer: BLUE CROSS/BLUE SHIELD | Admitting: Vascular Surgery

## 2017-09-13 VITALS — BP 94/71 | HR 78 | Temp 98.0°F | Resp 16 | Ht 65.0 in | Wt 223.0 lb

## 2017-09-13 DIAGNOSIS — I701 Atherosclerosis of renal artery: Secondary | ICD-10-CM | POA: Diagnosis not present

## 2017-09-13 NOTE — Progress Notes (Signed)
Patient ID: Savannah Ray, female   DOB: 1960/09/29, 56 y.o.   MRN: [id number]  Reason for Consult: New Patient (Initial Visit) (renal artery stenosis)   Referred by Tommye Standardaberwal, Daljit, MD  Subjective:     HPI:  Savannah Ray is a 56 y.o. female with history of hypertension and baseline renal insufficiency.  She is unaware of what her baseline kidney levels are but she was sent to a urologist who is then referred her here.  She does take to hypertensive medications and states that her blood pressure is well controlled and is actually low on our exam today.  She does not have any family history of stroke or coronary disease and does not have any personal history either.  She does not have high cholesterol to her knowledge is nondiabetic and has never been a smoker.  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  She states that overall she feels healthy.  She is never been admitted for pulmonary edema.  Past Medical History:  Diagnosis Date  . Hypertension   . Renal insufficiency    No family history of cardiovascular disease  No past surgery  Short Social History:  Social History   Tobacco Use  . Smoking status: Never Smoker  . Smokeless tobacco: Never Used  Substance Use Topics  . Alcohol use: No    No Known Allergies  Current Outpatient Medications  Medication Sig Dispense Refill  . lisinopril-hydrochlorothiazide (PRINZIDE,ZESTORETIC) 20-12.5 MG tablet Take 1 tablet by mouth daily. Hold this medication until able to drink properly again    . acetaminophen (TYLENOL) 500 MG tablet Take 2 tablets (1,000 mg total) by mouth every 8 (eight) hours as needed for mild pain, fever or headache (or Fever >/= 101). (Patient not taking: Reported on 09/13/2017)    . Biotin w/ Vitamins C & E (HAIR SKIN & NAILS GUMMIES) 1250-7.5-7.5 MCG-MG-UNT CHEW Chew 1 tablet by mouth daily.    . clindamycin (CLEOCIN) 300 MG capsule Take 1 capsule (300 mg total) by mouth 3  (three) times daily. (Patient not taking: Reported on 09/13/2017) 30 capsule 0  . oxyCODONE 10 MG TABS Take 1 tablet (10 mg total) by mouth every 4 (four) hours as needed for severe pain. (Patient not taking: Reported on 09/13/2017) 30 tablet 0  . vitamin B-12 (CYANOCOBALAMIN) 100 MCG tablet Take 100 mcg by mouth daily.     No current facility-administered medications for this visit.     Review of Systems  Constitutional:  Constitutional negative. HENT: HENT negative.  Eyes: Eyes negative.  Respiratory: Respiratory negative.  Skin: Skin negative.  Neurological: Neurological negative. Hematologic: Hematologic/lymphatic negative.  Psychiatric: Psychiatric negative.        Objective:  Objective   Vitals:   09/13/17 0952  BP: 94/71  Pulse: 78  Resp: 16  Temp: 98 F (36.7 C)  TempSrc: Oral  SpO2: 97%  Weight: 223 lb (101.2 kg)  Height: 5\' 5"  (1.651 m)   Body mass index is 37.11 kg/m.  Physical Exam  Constitutional: She is oriented to person, place, and time. She appears well-developed.  HENT:  Head: Normocephalic.  Eyes: Pupils are equal, round, and reactive to light.  Neck: Normal range of motion. Neck supple.  Cardiovascular: Normal rate.  Pulses:      Carotid pulses are 2+ on the right side, and 2+ on the left side.      Radial pulses are 2+ on the right side, and 2+ on the  left side.       Popliteal pulses are 2+ on the right side, and 2+ on the left side.       Dorsalis pedis pulses are 2+ on the right side, and 2+ on the left side.  Pulmonary/Chest: Effort normal.  Abdominal: Soft. She exhibits no mass.  Musculoskeletal: Normal range of motion. She exhibits no edema.  Neurological: She is alert and oriented to person, place, and time.  Skin: Skin is warm and dry.  Psychiatric: She has a normal mood and affect. Her behavior is normal. Thought content normal.    Data: [facility]  This demonstrates a right  renal artery stenosis in the proximal artery.  Renal to aortic ratio is elevated to 2.6 and peak velocity is 190.  The right kidney itself is 10.70 cm in length with no elevated resistive indices.  The left side is normal.     Assessment/Plan:     56 year old female presents for evaluation of right renal artery stenosis.  She has never had flash pulmonary edema but does have some underlying renal dysfunction.  I have a creatinine elevated as high as 1.7 in January 2017 that normalized over a period of a few days.  She is unsure of her current creatinine and I do not have this listed.  She does have a history of hypertension but is only on 2 drugs and today she is actually hypotensive.  For this reason I do not think she needs urgent revascularization of her right renal artery.  I will have her follow-up in 4-6 weeks with renal artery duplex in our office and we will also check a basic metabolic panel at that time to evaluate her kidney function.  If her kidney function is elevated and we can repeat her stenoses in her proximal right renal artery we can consider angiogram and possible stenting.  We will otherwise follow the stenosis and renal function.     Maeola HarmanBrandon Christopher Anessa Charley MD [facility]

## 2017-09-16 NOTE — Addendum Note (Signed)
Addended by: Burton ApleyPETTY, Shirleen Mcfaul A on: 09/16/2017 09:28 AM   Modules accepted: Orders

## 2017-10-17 DIAGNOSIS — R5383 Other fatigue: Secondary | ICD-10-CM | POA: Diagnosis not present

## 2017-10-17 DIAGNOSIS — E669 Obesity, unspecified: Secondary | ICD-10-CM | POA: Diagnosis not present

## 2017-10-17 DIAGNOSIS — M1990 Unspecified osteoarthritis, unspecified site: Secondary | ICD-10-CM | POA: Diagnosis not present

## 2017-10-17 DIAGNOSIS — I10 Essential (primary) hypertension: Secondary | ICD-10-CM | POA: Diagnosis not present

## 2017-11-01 ENCOUNTER — Encounter: Payer: Self-pay | Admitting: Vascular Surgery

## 2017-11-01 ENCOUNTER — Ambulatory Visit (HOSPITAL_COMMUNITY)
Admission: RE | Admit: 2017-11-01 | Discharge: 2017-11-01 | Disposition: A | Discharge status: Home / Self Care | Payer: BLUE CROSS/BLUE SHIELD | Source: Ambulatory Visit | Attending: Vascular Surgery | Admitting: Vascular Surgery

## 2017-11-01 ENCOUNTER — Other Ambulatory Visit: Payer: Self-pay

## 2017-11-01 ENCOUNTER — Ambulatory Visit (INDEPENDENT_AMBULATORY_CARE_PROVIDER_SITE_OTHER): Payer: BLUE CROSS/BLUE SHIELD | Admitting: Vascular Surgery

## 2017-11-01 VITALS — BP 140/97 | HR 62 | Temp 97.5°F | Resp 16 | Ht 65.0 in | Wt 224.0 lb

## 2017-11-01 DIAGNOSIS — I701 Atherosclerosis of renal artery: Secondary | ICD-10-CM | POA: Diagnosis not present

## 2017-11-01 NOTE — Progress Notes (Signed)
Patient ID: Savannah Ray, female   DOB: 08-May-1961, 57 y.o.   MRN: [id number]  Reason for Consult: No chief complaint on file.   Referred by No ref. provider found  Subjective:     HPI:  Savannah Ray is a 57 y.o. female who was initially referred here for concern of right renal artery stenosis as a cause of her hypertension.  She also has some baseline renal insufficiency but most recently was normal.  She has had an adjustment in her medication and her blood pressure has increased recently.  She has started taking aspirin.  She has no history of pulmonary edema.  She has no back or abdominal pain at this time.  Other than the change in blood pressure there is been no change since December and her medical history.  Past Medical History:  Diagnosis Date  . Hypertension   . Renal insufficiency    No family history of cardiovascular disease  No past surgery   Short Social History:  Social History   Tobacco Use  . Smoking status: Never Smoker  . Smokeless tobacco: Never Used  Substance Use Topics  . Alcohol use: No    No Known Allergies  Current Outpatient Medications  Medication Sig Dispense Refill  . acetaminophen (TYLENOL) 500 MG tablet Take 2 tablets (1,000 mg total) by mouth every 8 (eight) hours as needed for mild pain, fever or headache (or Fever >/= 101). (Patient not taking: Reported on 09/13/2017)    . Biotin w/ Vitamins C & E (HAIR SKIN & NAILS GUMMIES) 1250-7.5-7.5 MCG-MG-UNT CHEW Chew 1 tablet by mouth daily.    . clindamycin (CLEOCIN) 300 MG capsule Take 1 capsule (300 mg total) by mouth 3 (three) times daily. (Patient not taking: Reported on 09/13/2017) 30 capsule 0  . lisinopril-hydrochlorothiazide (PRINZIDE,ZESTORETIC) 20-12.5 MG tablet Take 1 tablet by mouth daily. Hold this medication until able to drink properly again    . oxyCODONE 10 MG TABS Take 1 tablet (10 mg total) by mouth every 4 (four) hours as needed for severe pain. (Patient not  taking: Reported on 09/13/2017) 30 tablet 0  . vitamin B-12 (CYANOCOBALAMIN) 100 MCG tablet Take 100 mcg by mouth daily.     No current facility-administered medications for this visit.     Review of Systems  Constitutional:  Constitutional negative. HENT: HENT negative.  Eyes: Eyes negative.  Respiratory: Respiratory negative.  Cardiovascular: Cardiovascular negative.  GI: Gastrointestinal negative.  Musculoskeletal: Musculoskeletal negative.  Skin: Skin negative.  Neurological: Neurological negative. Hematologic: Hematologic/lymphatic negative.  Psychiatric: Psychiatric negative.        Objective:  Objective   There were no vitals filed for this visit. There is no height or weight on file to calculate BMI.  Physical Exam  Constitutional: She is oriented to person, place, and time. She appears well-developed.  HENT:  Head: Normocephalic.  Eyes: Pupils are equal, round, and reactive to light.  Neck: Normal range of motion. Neck supple.  No bruits  Cardiovascular: Normal rate.  Pulses:      Dorsalis pedis pulses are 2+ on the right side, and 2+ on the left side.       Posterior tibial pulses are 2+ on the right side, and 2+ on the left side.  Pulmonary/Chest: Effort normal.  Abdominal: Soft. She exhibits no mass.  No bruits  Musculoskeletal: Normal range of motion. She exhibits no edema.  Neurological: She is alert and oriented to person, place, and time.  Skin: Skin is  warm and dry.  Psychiatric: She has a normal mood and affect. Her behavior is normal. Judgment and thought content normal.    Data: I independently interpreted results below      Assessment/Plan:     57 year old female follows up with renal artery duplex with concern for renal artery stenosis on outside duplex.  This was not substantiated by her own office today.  She has normal creatinine and her most recent check and was actually decreasing on her blood pressure medicines with no history of  pulmonary edema think that we will not pursue intervention at this time.  We will see her in 1 year with repeat duplex at which time if it remains normal she can likely wit follow-up as needed.  If she has issues before then we can certainly repeat the test sooner.  She demonstrates good understanding we will see her in 1 year.    Maeola Harman MD [facility]

## 2017-11-22 DIAGNOSIS — R05 Cough: Secondary | ICD-10-CM | POA: Diagnosis not present

## 2017-11-22 DIAGNOSIS — R52 Pain, unspecified: Secondary | ICD-10-CM | POA: Diagnosis not present

## 2017-11-22 DIAGNOSIS — I10 Essential (primary) hypertension: Secondary | ICD-10-CM | POA: Diagnosis not present

## 2017-11-22 DIAGNOSIS — J020 Streptococcal pharyngitis: Secondary | ICD-10-CM | POA: Diagnosis not present

## 2018-01-14 DIAGNOSIS — R195 Other fecal abnormalities: Secondary | ICD-10-CM | POA: Diagnosis not present

## 2018-01-14 DIAGNOSIS — R109 Unspecified abdominal pain: Secondary | ICD-10-CM | POA: Diagnosis not present

## 2018-01-16 DIAGNOSIS — K625 Hemorrhage of anus and rectum: Secondary | ICD-10-CM | POA: Diagnosis not present

## 2018-01-16 DIAGNOSIS — Z800 Family history of malignant neoplasm of digestive organs: Secondary | ICD-10-CM | POA: Diagnosis not present

## 2018-01-16 DIAGNOSIS — R102 Pelvic and perineal pain: Secondary | ICD-10-CM | POA: Diagnosis not present

## 2018-01-16 DIAGNOSIS — Z1211 Encounter for screening for malignant neoplasm of colon: Secondary | ICD-10-CM | POA: Diagnosis not present

## 2018-01-24 DIAGNOSIS — Z1211 Encounter for screening for malignant neoplasm of colon: Secondary | ICD-10-CM | POA: Diagnosis not present

## 2018-01-24 DIAGNOSIS — K5730 Diverticulosis of large intestine without perforation or abscess without bleeding: Secondary | ICD-10-CM | POA: Diagnosis not present

## 2018-01-24 DIAGNOSIS — Z800 Family history of malignant neoplasm of digestive organs: Secondary | ICD-10-CM | POA: Diagnosis not present

## 2018-01-24 DIAGNOSIS — K529 Noninfective gastroenteritis and colitis, unspecified: Secondary | ICD-10-CM | POA: Diagnosis not present

## 2018-01-24 DIAGNOSIS — K621 Rectal polyp: Secondary | ICD-10-CM | POA: Diagnosis not present

## 2018-01-24 DIAGNOSIS — D128 Benign neoplasm of rectum: Secondary | ICD-10-CM | POA: Diagnosis not present

## 2018-02-13 DIAGNOSIS — Z01419 Encounter for gynecological examination (general) (routine) without abnormal findings: Secondary | ICD-10-CM | POA: Diagnosis not present

## 2018-02-13 DIAGNOSIS — Z6837 Body mass index (BMI) 37.0-37.9, adult: Secondary | ICD-10-CM | POA: Diagnosis not present

## 2018-02-13 DIAGNOSIS — Z1231 Encounter for screening mammogram for malignant neoplasm of breast: Secondary | ICD-10-CM | POA: Diagnosis not present

## 2018-02-19 DIAGNOSIS — I129 Hypertensive chronic kidney disease with stage 1 through stage 4 chronic kidney disease, or unspecified chronic kidney disease: Secondary | ICD-10-CM | POA: Diagnosis not present

## 2018-02-19 DIAGNOSIS — I701 Atherosclerosis of renal artery: Secondary | ICD-10-CM | POA: Diagnosis not present

## 2018-02-19 DIAGNOSIS — N184 Chronic kidney disease, stage 4 (severe): Secondary | ICD-10-CM | POA: Diagnosis not present

## 2018-03-17 DIAGNOSIS — M19011 Primary osteoarthritis, right shoulder: Secondary | ICD-10-CM | POA: Diagnosis not present

## 2018-03-17 DIAGNOSIS — N184 Chronic kidney disease, stage 4 (severe): Secondary | ICD-10-CM | POA: Diagnosis not present

## 2018-03-17 DIAGNOSIS — R5383 Other fatigue: Secondary | ICD-10-CM | POA: Diagnosis not present

## 2018-03-17 DIAGNOSIS — I10 Essential (primary) hypertension: Secondary | ICD-10-CM | POA: Diagnosis not present

## 2018-03-17 DIAGNOSIS — M545 Low back pain: Secondary | ICD-10-CM | POA: Diagnosis not present

## 2018-03-17 DIAGNOSIS — N289 Disorder of kidney and ureter, unspecified: Secondary | ICD-10-CM | POA: Diagnosis not present

## 2018-04-18 DIAGNOSIS — N184 Chronic kidney disease, stage 4 (severe): Secondary | ICD-10-CM | POA: Diagnosis not present

## 2018-04-18 DIAGNOSIS — E559 Vitamin D deficiency, unspecified: Secondary | ICD-10-CM | POA: Diagnosis not present

## 2018-04-18 DIAGNOSIS — I129 Hypertensive chronic kidney disease with stage 1 through stage 4 chronic kidney disease, or unspecified chronic kidney disease: Secondary | ICD-10-CM | POA: Diagnosis not present

## 2018-04-18 DIAGNOSIS — I701 Atherosclerosis of renal artery: Secondary | ICD-10-CM | POA: Diagnosis not present

## 2018-05-09 DIAGNOSIS — I129 Hypertensive chronic kidney disease with stage 1 through stage 4 chronic kidney disease, or unspecified chronic kidney disease: Secondary | ICD-10-CM | POA: Diagnosis not present

## 2018-06-06 DIAGNOSIS — Z6835 Body mass index (BMI) 35.0-35.9, adult: Secondary | ICD-10-CM | POA: Diagnosis not present

## 2018-06-06 DIAGNOSIS — Z23 Encounter for immunization: Secondary | ICD-10-CM | POA: Diagnosis not present

## 2018-06-06 DIAGNOSIS — N760 Acute vaginitis: Secondary | ICD-10-CM | POA: Diagnosis not present

## 2018-06-19 DIAGNOSIS — I10 Essential (primary) hypertension: Secondary | ICD-10-CM | POA: Diagnosis not present

## 2018-06-19 DIAGNOSIS — M19011 Primary osteoarthritis, right shoulder: Secondary | ICD-10-CM | POA: Diagnosis not present

## 2018-06-19 DIAGNOSIS — N760 Acute vaginitis: Secondary | ICD-10-CM | POA: Diagnosis not present

## 2018-06-19 DIAGNOSIS — Z6836 Body mass index (BMI) 36.0-36.9, adult: Secondary | ICD-10-CM | POA: Diagnosis not present

## 2018-07-08 DIAGNOSIS — N183 Chronic kidney disease, stage 3 (moderate): Secondary | ICD-10-CM | POA: Diagnosis not present

## 2018-07-08 DIAGNOSIS — I129 Hypertensive chronic kidney disease with stage 1 through stage 4 chronic kidney disease, or unspecified chronic kidney disease: Secondary | ICD-10-CM | POA: Diagnosis not present

## 2018-07-08 DIAGNOSIS — I701 Atherosclerosis of renal artery: Secondary | ICD-10-CM | POA: Diagnosis not present

## 2018-07-08 DIAGNOSIS — E875 Hyperkalemia: Secondary | ICD-10-CM | POA: Diagnosis not present

## 2018-10-30 IMAGING — MG 2D DIGITAL DIAGNOSTIC UNILATERAL LEFT MAMMOGRAM WITH CAD AND ADJ
9 series · 9 of 21 positions shown · non-contrast
Comparison: 01/28/2017, 05/16/2011.

CLINICAL DATA: Recall from screening mammography with
tomosynthesis, possible mass or focal asymmetry in the lower outer
left breast at middle depth.

EXAM:
2D DIGITAL DIAGNOSTIC UNILATERAL LEFT MAMMOGRAM WITH ADJUNCT TOMO

[L MLO synth-2D]
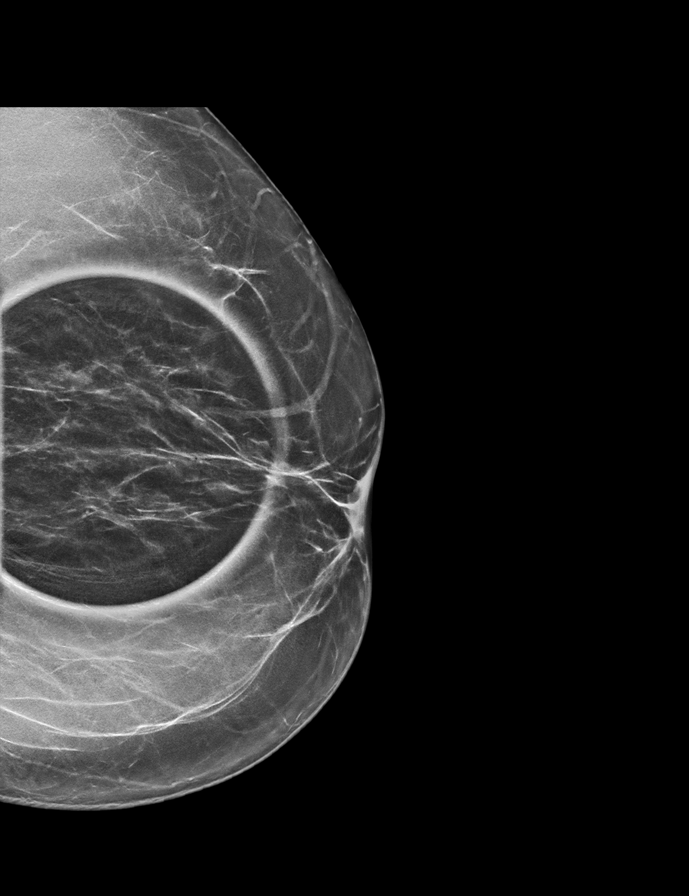

[L MLO]
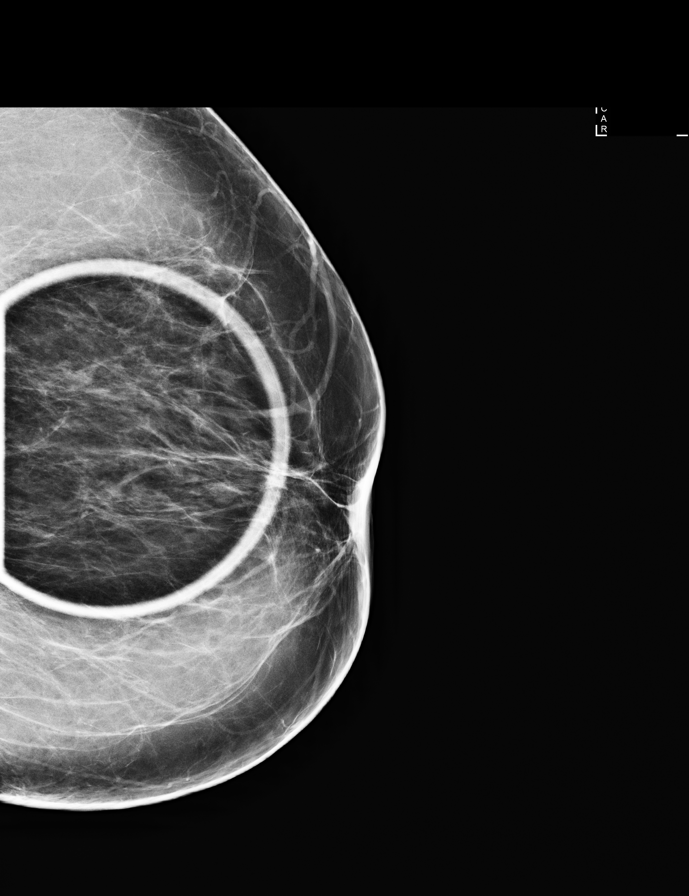

[L CC (1 of 2)]
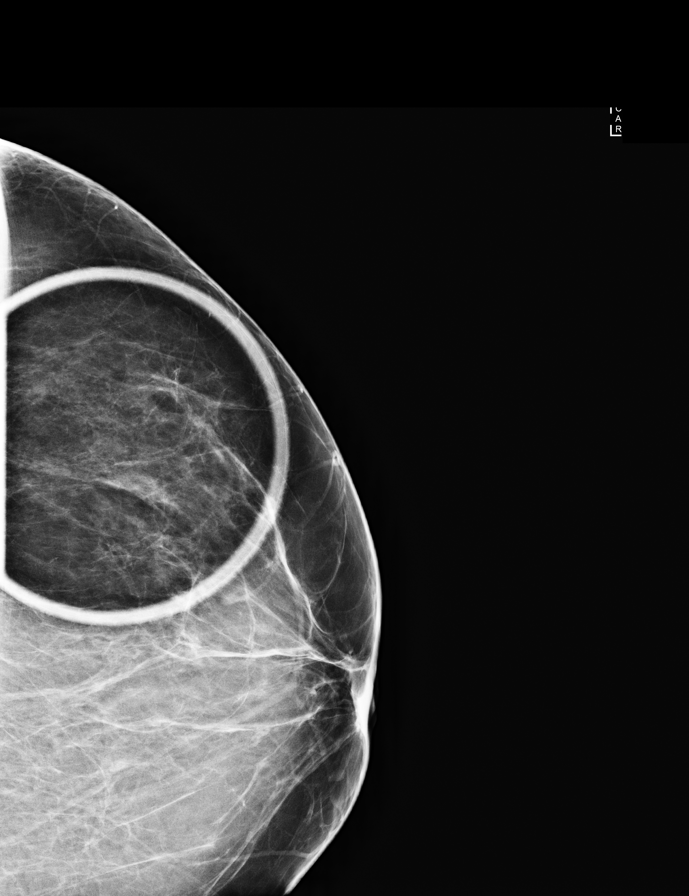

[L CC synth-2D (1 of 2)]
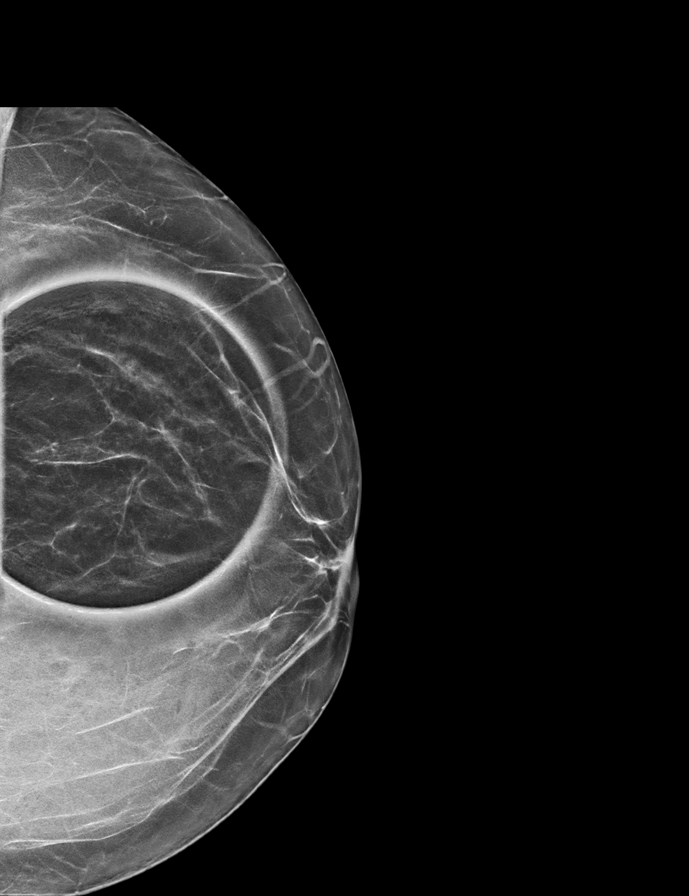

[L CC (2 of 2)]
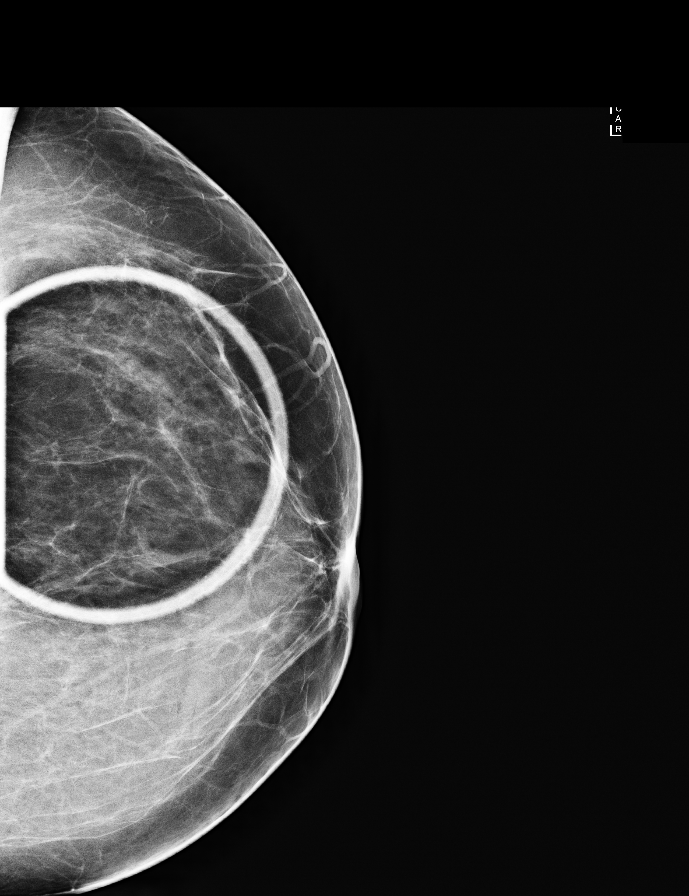

[L CC synth-2D (2 of 2)]
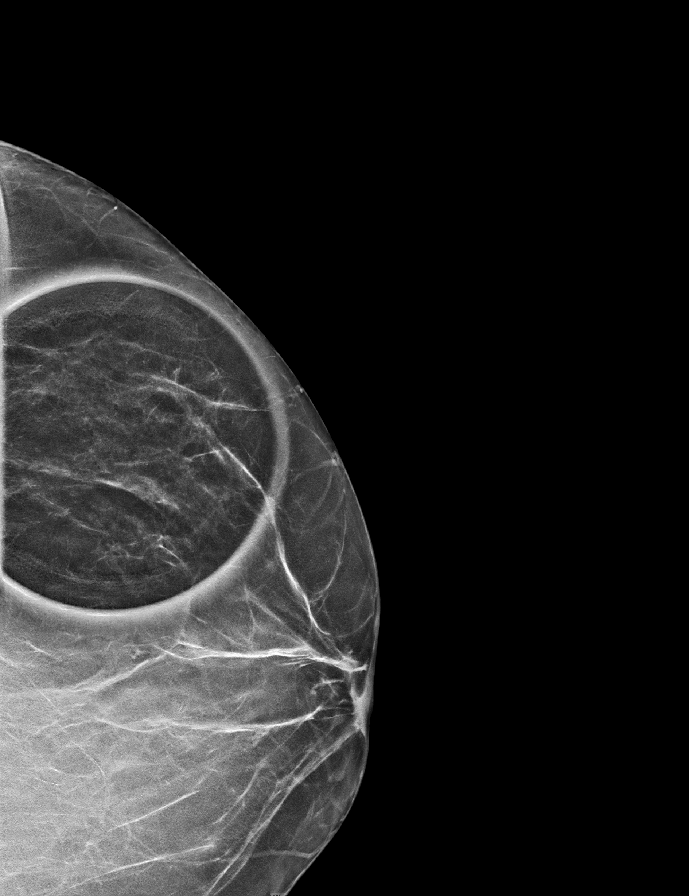

[L MLO tomo · tomo slice 29/58.0]
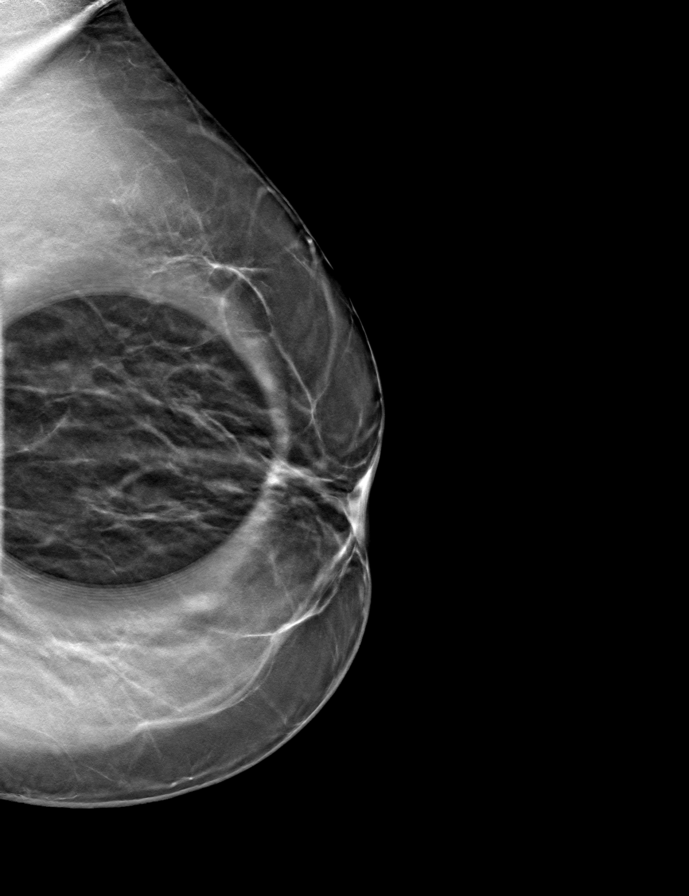

[L CC tomo (1 of 2) · tomo slice 29/57.0]
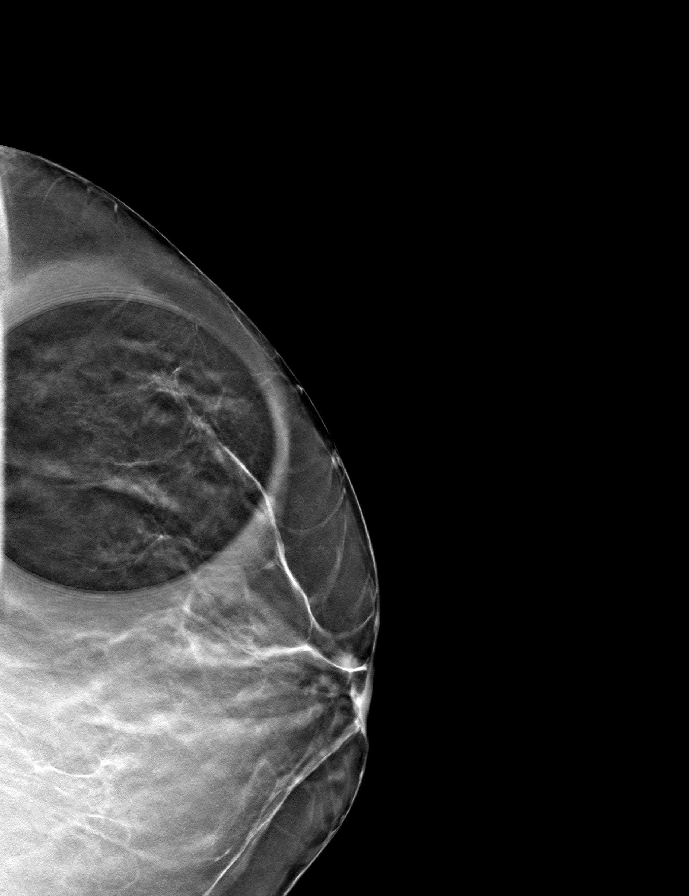

[L CC tomo (2 of 2) · tomo slice 28/55.0]
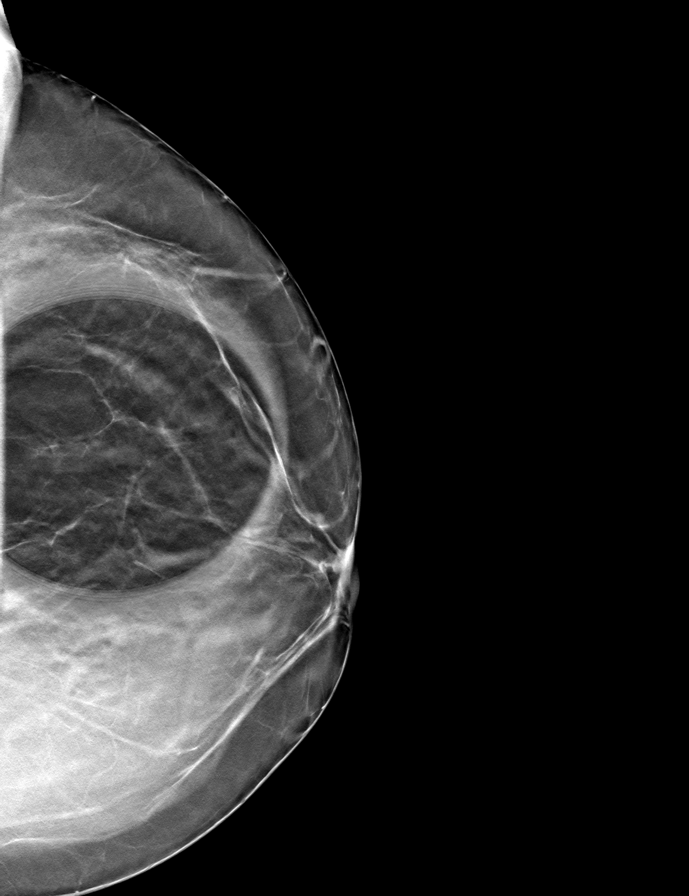

[9 of 21 positions shown; findings below may reference images not displayed]

ACR Breast Density Category b: There are scattered areas of
fibroglandular density.
FINDINGS: Standard and tomosynthesis spot compression CC and MLO views of the
area of concern in the lower outer left breast were obtained. The
focal asymmetry disperses with compression and there is no
underlying mass or architectural distortion.
IMPRESSION: No mammographic evidence of malignancy, left breast. The area of
concern on screening mammography corresponds to focally dense
fibroglandular tissue.

RECOMMENDATION:
Screening mammogram in one year.(Code:4D-P-USX)

I have discussed the findings and recommendations with the patient.
Results were also provided in writing at the conclusion of the
visit. If applicable, a reminder letter will be sent to the patient
regarding the next appointment.

BI-RADS CATEGORY  1: Negative.

## 2019-01-05 DIAGNOSIS — N183 Chronic kidney disease, stage 3 (moderate): Secondary | ICD-10-CM | POA: Diagnosis not present

## 2019-01-12 DIAGNOSIS — E875 Hyperkalemia: Secondary | ICD-10-CM | POA: Diagnosis not present

## 2019-01-12 DIAGNOSIS — N183 Chronic kidney disease, stage 3 (moderate): Secondary | ICD-10-CM | POA: Diagnosis not present

## 2019-01-12 DIAGNOSIS — I129 Hypertensive chronic kidney disease with stage 1 through stage 4 chronic kidney disease, or unspecified chronic kidney disease: Secondary | ICD-10-CM | POA: Diagnosis not present

## 2019-01-12 DIAGNOSIS — I701 Atherosclerosis of renal artery: Secondary | ICD-10-CM | POA: Diagnosis not present

## 2019-01-28 DIAGNOSIS — E559 Vitamin D deficiency, unspecified: Secondary | ICD-10-CM | POA: Diagnosis not present

## 2019-02-04 DIAGNOSIS — Z20828 Contact with and (suspected) exposure to other viral communicable diseases: Secondary | ICD-10-CM | POA: Diagnosis not present

## 2019-04-13 DIAGNOSIS — Z01419 Encounter for gynecological examination (general) (routine) without abnormal findings: Secondary | ICD-10-CM | POA: Diagnosis not present

## 2019-04-13 DIAGNOSIS — Z1231 Encounter for screening mammogram for malignant neoplasm of breast: Secondary | ICD-10-CM | POA: Diagnosis not present

## 2019-04-13 DIAGNOSIS — Z13820 Encounter for screening for osteoporosis: Secondary | ICD-10-CM | POA: Diagnosis not present

## 2019-04-13 DIAGNOSIS — Z6837 Body mass index (BMI) 37.0-37.9, adult: Secondary | ICD-10-CM | POA: Diagnosis not present

## 2019-04-24 DIAGNOSIS — I8311 Varicose veins of right lower extremity with inflammation: Secondary | ICD-10-CM | POA: Diagnosis not present

## 2019-04-24 DIAGNOSIS — I8312 Varicose veins of left lower extremity with inflammation: Secondary | ICD-10-CM | POA: Diagnosis not present

## 2019-05-12 DIAGNOSIS — I8312 Varicose veins of left lower extremity with inflammation: Secondary | ICD-10-CM | POA: Diagnosis not present

## 2019-05-12 DIAGNOSIS — I8311 Varicose veins of right lower extremity with inflammation: Secondary | ICD-10-CM | POA: Diagnosis not present

## 2019-05-26 DIAGNOSIS — I8312 Varicose veins of left lower extremity with inflammation: Secondary | ICD-10-CM | POA: Diagnosis not present

## 2019-05-26 DIAGNOSIS — I8311 Varicose veins of right lower extremity with inflammation: Secondary | ICD-10-CM | POA: Diagnosis not present

## 2019-07-30 DIAGNOSIS — N183 Chronic kidney disease, stage 3 unspecified: Secondary | ICD-10-CM | POA: Diagnosis not present

## 2019-08-05 DIAGNOSIS — I701 Atherosclerosis of renal artery: Secondary | ICD-10-CM | POA: Diagnosis not present

## 2019-08-05 DIAGNOSIS — I129 Hypertensive chronic kidney disease with stage 1 through stage 4 chronic kidney disease, or unspecified chronic kidney disease: Secondary | ICD-10-CM | POA: Diagnosis not present

## 2019-08-05 DIAGNOSIS — E875 Hyperkalemia: Secondary | ICD-10-CM | POA: Diagnosis not present

## 2019-08-05 DIAGNOSIS — N183 Chronic kidney disease, stage 3 unspecified: Secondary | ICD-10-CM | POA: Diagnosis not present

## 2020-04-18 DIAGNOSIS — Z1231 Encounter for screening mammogram for malignant neoplasm of breast: Secondary | ICD-10-CM | POA: Diagnosis not present

## 2020-04-18 DIAGNOSIS — Z6835 Body mass index (BMI) 35.0-35.9, adult: Secondary | ICD-10-CM | POA: Diagnosis not present

## 2020-04-18 DIAGNOSIS — Z01419 Encounter for gynecological examination (general) (routine) without abnormal findings: Secondary | ICD-10-CM | POA: Diagnosis not present

## 2020-05-17 DIAGNOSIS — I8312 Varicose veins of left lower extremity with inflammation: Secondary | ICD-10-CM | POA: Diagnosis not present

## 2020-06-03 DIAGNOSIS — I8311 Varicose veins of right lower extremity with inflammation: Secondary | ICD-10-CM | POA: Diagnosis not present

## 2020-06-18 DIAGNOSIS — Z20822 Contact with and (suspected) exposure to covid-19: Secondary | ICD-10-CM | POA: Diagnosis not present

## 2020-06-18 DIAGNOSIS — M791 Myalgia, unspecified site: Secondary | ICD-10-CM | POA: Diagnosis not present

## 2020-06-18 DIAGNOSIS — B349 Viral infection, unspecified: Secondary | ICD-10-CM | POA: Diagnosis not present

## 2020-06-21 DIAGNOSIS — I8312 Varicose veins of left lower extremity with inflammation: Secondary | ICD-10-CM | POA: Diagnosis not present

## 2020-06-21 DIAGNOSIS — M7981 Nontraumatic hematoma of soft tissue: Secondary | ICD-10-CM | POA: Diagnosis not present

## 2020-06-28 DIAGNOSIS — I8311 Varicose veins of right lower extremity with inflammation: Secondary | ICD-10-CM | POA: Diagnosis not present

## 2020-06-28 DIAGNOSIS — M7981 Nontraumatic hematoma of soft tissue: Secondary | ICD-10-CM | POA: Diagnosis not present

## 2020-07-05 DIAGNOSIS — M7981 Nontraumatic hematoma of soft tissue: Secondary | ICD-10-CM | POA: Diagnosis not present

## 2020-07-05 DIAGNOSIS — I8312 Varicose veins of left lower extremity with inflammation: Secondary | ICD-10-CM | POA: Diagnosis not present

## 2020-07-13 DIAGNOSIS — I8311 Varicose veins of right lower extremity with inflammation: Secondary | ICD-10-CM | POA: Diagnosis not present

## 2021-02-02 DIAGNOSIS — N183 Chronic kidney disease, stage 3 unspecified: Secondary | ICD-10-CM | POA: Diagnosis not present

## 2021-02-06 DIAGNOSIS — E875 Hyperkalemia: Secondary | ICD-10-CM | POA: Diagnosis not present

## 2021-02-06 DIAGNOSIS — I701 Atherosclerosis of renal artery: Secondary | ICD-10-CM | POA: Diagnosis not present

## 2021-02-06 DIAGNOSIS — I129 Hypertensive chronic kidney disease with stage 1 through stage 4 chronic kidney disease, or unspecified chronic kidney disease: Secondary | ICD-10-CM | POA: Diagnosis not present

## 2021-02-06 DIAGNOSIS — N1831 Chronic kidney disease, stage 3a: Secondary | ICD-10-CM | POA: Diagnosis not present

## 2021-04-22 DIAGNOSIS — J069 Acute upper respiratory infection, unspecified: Secondary | ICD-10-CM | POA: Diagnosis not present

## 2021-04-22 DIAGNOSIS — Z20822 Contact with and (suspected) exposure to covid-19: Secondary | ICD-10-CM | POA: Diagnosis not present

## 2021-06-19 DIAGNOSIS — Z7982 Long term (current) use of aspirin: Secondary | ICD-10-CM | POA: Diagnosis not present

## 2021-06-19 DIAGNOSIS — Z20822 Contact with and (suspected) exposure to covid-19: Secondary | ICD-10-CM | POA: Diagnosis not present

## 2021-06-19 DIAGNOSIS — K828 Other specified diseases of gallbladder: Secondary | ICD-10-CM | POA: Diagnosis not present

## 2021-06-19 DIAGNOSIS — K859 Acute pancreatitis without necrosis or infection, unspecified: Secondary | ICD-10-CM | POA: Diagnosis not present

## 2021-06-19 DIAGNOSIS — K8060 Calculus of gallbladder and bile duct with cholecystitis, unspecified, without obstruction: Secondary | ICD-10-CM | POA: Diagnosis not present

## 2021-06-19 DIAGNOSIS — D72829 Elevated white blood cell count, unspecified: Secondary | ICD-10-CM | POA: Diagnosis not present

## 2021-06-19 DIAGNOSIS — K8010 Calculus of gallbladder with chronic cholecystitis without obstruction: Secondary | ICD-10-CM | POA: Diagnosis not present

## 2021-06-19 DIAGNOSIS — K851 Biliary acute pancreatitis without necrosis or infection: Secondary | ICD-10-CM | POA: Diagnosis not present

## 2021-06-19 DIAGNOSIS — Z79899 Other long term (current) drug therapy: Secondary | ICD-10-CM | POA: Diagnosis not present

## 2021-06-19 DIAGNOSIS — R932 Abnormal findings on diagnostic imaging of liver and biliary tract: Secondary | ICD-10-CM | POA: Diagnosis not present

## 2021-06-19 DIAGNOSIS — N183 Chronic kidney disease, stage 3 unspecified: Secondary | ICD-10-CM | POA: Diagnosis not present

## 2021-06-19 DIAGNOSIS — I129 Hypertensive chronic kidney disease with stage 1 through stage 4 chronic kidney disease, or unspecified chronic kidney disease: Secondary | ICD-10-CM | POA: Diagnosis not present

## 2021-06-19 DIAGNOSIS — R110 Nausea: Secondary | ICD-10-CM | POA: Diagnosis not present

## 2021-06-19 DIAGNOSIS — K8020 Calculus of gallbladder without cholecystitis without obstruction: Secondary | ICD-10-CM | POA: Diagnosis not present

## 2021-06-19 DIAGNOSIS — R1011 Right upper quadrant pain: Secondary | ICD-10-CM | POA: Diagnosis not present

## 2021-06-19 DIAGNOSIS — R109 Unspecified abdominal pain: Secondary | ICD-10-CM | POA: Diagnosis not present

## 2021-06-20 DIAGNOSIS — E669 Obesity, unspecified: Secondary | ICD-10-CM | POA: Diagnosis not present

## 2021-06-20 DIAGNOSIS — I129 Hypertensive chronic kidney disease with stage 1 through stage 4 chronic kidney disease, or unspecified chronic kidney disease: Secondary | ICD-10-CM | POA: Diagnosis not present

## 2021-06-20 DIAGNOSIS — D72829 Elevated white blood cell count, unspecified: Secondary | ICD-10-CM | POA: Diagnosis not present

## 2021-06-20 DIAGNOSIS — K8050 Calculus of bile duct without cholangitis or cholecystitis without obstruction: Secondary | ICD-10-CM | POA: Diagnosis not present

## 2021-06-20 DIAGNOSIS — K8062 Calculus of gallbladder and bile duct with acute cholecystitis without obstruction: Secondary | ICD-10-CM | POA: Diagnosis not present

## 2021-06-20 DIAGNOSIS — K859 Acute pancreatitis without necrosis or infection, unspecified: Secondary | ICD-10-CM | POA: Diagnosis not present

## 2021-06-20 DIAGNOSIS — Z79899 Other long term (current) drug therapy: Secondary | ICD-10-CM | POA: Diagnosis not present

## 2021-06-20 DIAGNOSIS — N183 Chronic kidney disease, stage 3 unspecified: Secondary | ICD-10-CM | POA: Diagnosis not present

## 2021-06-20 DIAGNOSIS — R109 Unspecified abdominal pain: Secondary | ICD-10-CM | POA: Diagnosis not present

## 2021-06-20 DIAGNOSIS — R112 Nausea with vomiting, unspecified: Secondary | ICD-10-CM | POA: Diagnosis not present

## 2021-06-20 DIAGNOSIS — Z8719 Personal history of other diseases of the digestive system: Secondary | ICD-10-CM | POA: Diagnosis not present

## 2021-06-20 DIAGNOSIS — K8060 Calculus of gallbladder and bile duct with cholecystitis, unspecified, without obstruction: Secondary | ICD-10-CM | POA: Diagnosis not present

## 2021-06-20 DIAGNOSIS — Z7982 Long term (current) use of aspirin: Secondary | ICD-10-CM | POA: Diagnosis not present

## 2021-06-20 DIAGNOSIS — K810 Acute cholecystitis: Secondary | ICD-10-CM | POA: Diagnosis not present

## 2021-06-20 DIAGNOSIS — K851 Biliary acute pancreatitis without necrosis or infection: Secondary | ICD-10-CM | POA: Diagnosis not present

## 2021-06-20 DIAGNOSIS — K8010 Calculus of gallbladder with chronic cholecystitis without obstruction: Secondary | ICD-10-CM | POA: Diagnosis not present

## 2021-06-20 DIAGNOSIS — K8067 Calculus of gallbladder and bile duct with acute and chronic cholecystitis with obstruction: Secondary | ICD-10-CM | POA: Diagnosis not present

## 2021-06-20 DIAGNOSIS — R7401 Elevation of levels of liver transaminase levels: Secondary | ICD-10-CM | POA: Diagnosis not present

## 2021-06-20 DIAGNOSIS — Z9049 Acquired absence of other specified parts of digestive tract: Secondary | ICD-10-CM | POA: Diagnosis not present

## 2021-06-20 DIAGNOSIS — Z20822 Contact with and (suspected) exposure to covid-19: Secondary | ICD-10-CM | POA: Diagnosis not present

## 2021-06-20 DIAGNOSIS — R932 Abnormal findings on diagnostic imaging of liver and biliary tract: Secondary | ICD-10-CM | POA: Diagnosis not present

## 2021-06-20 DIAGNOSIS — K8070 Calculus of gallbladder and bile duct without cholecystitis without obstruction: Secondary | ICD-10-CM | POA: Diagnosis not present

## 2021-06-20 DIAGNOSIS — K828 Other specified diseases of gallbladder: Secondary | ICD-10-CM | POA: Diagnosis not present

## 2021-06-20 DIAGNOSIS — Z6833 Body mass index (BMI) 33.0-33.9, adult: Secondary | ICD-10-CM | POA: Diagnosis not present

## 2021-10-08 DIAGNOSIS — R1031 Right lower quadrant pain: Secondary | ICD-10-CM | POA: Diagnosis not present

## 2021-10-08 DIAGNOSIS — R110 Nausea: Secondary | ICD-10-CM | POA: Diagnosis not present

## 2021-10-08 DIAGNOSIS — K5732 Diverticulitis of large intestine without perforation or abscess without bleeding: Secondary | ICD-10-CM | POA: Diagnosis not present

## 2021-10-08 DIAGNOSIS — N183 Chronic kidney disease, stage 3 unspecified: Secondary | ICD-10-CM | POA: Diagnosis not present

## 2021-10-08 DIAGNOSIS — R1032 Left lower quadrant pain: Secondary | ICD-10-CM | POA: Diagnosis not present

## 2021-10-08 DIAGNOSIS — Z79899 Other long term (current) drug therapy: Secondary | ICD-10-CM | POA: Diagnosis not present

## 2021-10-08 DIAGNOSIS — R109 Unspecified abdominal pain: Secondary | ICD-10-CM | POA: Diagnosis not present

## 2021-10-08 DIAGNOSIS — Z7982 Long term (current) use of aspirin: Secondary | ICD-10-CM | POA: Diagnosis not present

## 2021-10-08 DIAGNOSIS — I129 Hypertensive chronic kidney disease with stage 1 through stage 4 chronic kidney disease, or unspecified chronic kidney disease: Secondary | ICD-10-CM | POA: Diagnosis not present

## 2021-10-08 DIAGNOSIS — Z20822 Contact with and (suspected) exposure to covid-19: Secondary | ICD-10-CM | POA: Diagnosis not present

## 2021-10-09 DIAGNOSIS — R109 Unspecified abdominal pain: Secondary | ICD-10-CM | POA: Diagnosis not present

## 2021-11-18 DIAGNOSIS — H1013 Acute atopic conjunctivitis, bilateral: Secondary | ICD-10-CM | POA: Diagnosis not present

## 2021-11-20 DIAGNOSIS — K5790 Diverticulosis of intestine, part unspecified, without perforation or abscess without bleeding: Secondary | ICD-10-CM | POA: Diagnosis not present

## 2021-11-20 DIAGNOSIS — I10 Essential (primary) hypertension: Secondary | ICD-10-CM | POA: Diagnosis not present

## 2021-11-20 DIAGNOSIS — N951 Menopausal and female climacteric states: Secondary | ICD-10-CM | POA: Diagnosis not present

## 2021-11-20 DIAGNOSIS — N1832 Chronic kidney disease, stage 3b: Secondary | ICD-10-CM | POA: Diagnosis not present

## 2022-01-22 DIAGNOSIS — Z1231 Encounter for screening mammogram for malignant neoplasm of breast: Secondary | ICD-10-CM | POA: Diagnosis not present

## 2022-01-22 DIAGNOSIS — Z01419 Encounter for gynecological examination (general) (routine) without abnormal findings: Secondary | ICD-10-CM | POA: Diagnosis not present

## 2022-01-22 DIAGNOSIS — Z6834 Body mass index (BMI) 34.0-34.9, adult: Secondary | ICD-10-CM | POA: Diagnosis not present

## 2022-01-30 DIAGNOSIS — N1831 Chronic kidney disease, stage 3a: Secondary | ICD-10-CM | POA: Diagnosis not present

## 2022-02-02 DIAGNOSIS — N1831 Chronic kidney disease, stage 3a: Secondary | ICD-10-CM | POA: Diagnosis not present

## 2022-02-02 DIAGNOSIS — E875 Hyperkalemia: Secondary | ICD-10-CM | POA: Diagnosis not present

## 2022-02-02 DIAGNOSIS — I701 Atherosclerosis of renal artery: Secondary | ICD-10-CM | POA: Diagnosis not present

## 2022-02-02 DIAGNOSIS — I129 Hypertensive chronic kidney disease with stage 1 through stage 4 chronic kidney disease, or unspecified chronic kidney disease: Secondary | ICD-10-CM | POA: Diagnosis not present

## 2022-03-23 DIAGNOSIS — K5792 Diverticulitis of intestine, part unspecified, without perforation or abscess without bleeding: Secondary | ICD-10-CM | POA: Diagnosis not present

## 2023-04-05 DIAGNOSIS — K5792 Diverticulitis of intestine, part unspecified, without perforation or abscess without bleeding: Secondary | ICD-10-CM | POA: Diagnosis not present

## 2023-05-23 DIAGNOSIS — N183 Chronic kidney disease, stage 3 unspecified: Secondary | ICD-10-CM | POA: Diagnosis not present

## 2023-07-22 DIAGNOSIS — N1832 Chronic kidney disease, stage 3b: Secondary | ICD-10-CM | POA: Diagnosis not present

## 2023-07-22 DIAGNOSIS — Z1331 Encounter for screening for depression: Secondary | ICD-10-CM | POA: Diagnosis not present

## 2023-07-22 DIAGNOSIS — E559 Vitamin D deficiency, unspecified: Secondary | ICD-10-CM | POA: Diagnosis not present

## 2023-07-22 DIAGNOSIS — I10 Essential (primary) hypertension: Secondary | ICD-10-CM | POA: Diagnosis not present

## 2023-08-28 DIAGNOSIS — N183 Chronic kidney disease, stage 3 unspecified: Secondary | ICD-10-CM | POA: Diagnosis not present

## 2023-09-04 DIAGNOSIS — E875 Hyperkalemia: Secondary | ICD-10-CM | POA: Diagnosis not present

## 2023-09-04 DIAGNOSIS — I701 Atherosclerosis of renal artery: Secondary | ICD-10-CM | POA: Diagnosis not present

## 2023-09-04 DIAGNOSIS — I129 Hypertensive chronic kidney disease with stage 1 through stage 4 chronic kidney disease, or unspecified chronic kidney disease: Secondary | ICD-10-CM | POA: Diagnosis not present

## 2023-09-04 DIAGNOSIS — N1831 Chronic kidney disease, stage 3a: Secondary | ICD-10-CM | POA: Diagnosis not present

## 2024-07-10 ENCOUNTER — Encounter: Payer: Self-pay | Admitting: Internal Medicine
